# Patient Record
Sex: Female | Born: 1965 | Race: White | Hispanic: No | Marital: Married | Smoking: Never smoker
Health system: Southern US, Community
[De-identification: ages and names within clinical notes are randomized; demographics above are authoritative.]

## PROBLEM LIST (undated history)

## (undated) DIAGNOSIS — R072 Precordial pain: Secondary | ICD-10-CM

## (undated) DIAGNOSIS — E119 Type 2 diabetes mellitus without complications: Secondary | ICD-10-CM

## (undated) DIAGNOSIS — I514 Myocarditis, unspecified: Secondary | ICD-10-CM

## (undated) DIAGNOSIS — F329 Major depressive disorder, single episode, unspecified: Secondary | ICD-10-CM

## (undated) DIAGNOSIS — R0602 Shortness of breath: Secondary | ICD-10-CM

## (undated) DIAGNOSIS — F419 Anxiety disorder, unspecified: Secondary | ICD-10-CM

## (undated) DIAGNOSIS — I472 Ventricular tachycardia, unspecified: Secondary | ICD-10-CM

## (undated) DIAGNOSIS — E785 Hyperlipidemia, unspecified: Secondary | ICD-10-CM

## (undated) DIAGNOSIS — F32A Depression, unspecified: Secondary | ICD-10-CM

## (undated) HISTORY — DX: Ventricular tachycardia: I47.2

## (undated) HISTORY — DX: Shortness of breath: R06.02

## (undated) HISTORY — PX: TUBAL LIGATION: SHX77

## (undated) HISTORY — DX: Myocarditis, unspecified: I51.4

## (undated) HISTORY — PX: APPENDECTOMY: SHX54

## (undated) HISTORY — DX: Precordial pain: R07.2

## (undated) HISTORY — PX: BREAST SURGERY: SHX581

## (undated) HISTORY — DX: Ventricular tachycardia, unspecified: I47.20

---

## 2001-09-12 ENCOUNTER — Other Ambulatory Visit: Admission: RE | Admit: 2001-09-12 | Discharge: 2001-09-12 | Payer: Self-pay | Admitting: Family Medicine

## 2012-01-04 DIAGNOSIS — K449 Diaphragmatic hernia without obstruction or gangrene: Secondary | ICD-10-CM | POA: Insufficient documentation

## 2012-01-04 HISTORY — DX: Morbid (severe) obesity due to excess calories: E66.01

## 2012-01-04 HISTORY — DX: Diaphragmatic hernia without obstruction or gangrene: K44.9

## 2012-12-02 ENCOUNTER — Emergency Department (HOSPITAL_BASED_OUTPATIENT_CLINIC_OR_DEPARTMENT_OTHER)
Admission: EM | Admit: 2012-12-02 | Discharge: 2012-12-02 | Disposition: A | Discharge status: Home / Self Care | Payer: Managed Care, Other (non HMO) | Attending: Emergency Medicine | Admitting: Emergency Medicine

## 2012-12-02 ENCOUNTER — Emergency Department (HOSPITAL_BASED_OUTPATIENT_CLINIC_OR_DEPARTMENT_OTHER): Payer: Managed Care, Other (non HMO)

## 2012-12-02 ENCOUNTER — Encounter (HOSPITAL_BASED_OUTPATIENT_CLINIC_OR_DEPARTMENT_OTHER): Payer: Self-pay | Admitting: *Deleted

## 2012-12-02 DIAGNOSIS — F411 Generalized anxiety disorder: Secondary | ICD-10-CM | POA: Insufficient documentation

## 2012-12-02 DIAGNOSIS — L03039 Cellulitis of unspecified toe: Secondary | ICD-10-CM | POA: Insufficient documentation

## 2012-12-02 DIAGNOSIS — L03031 Cellulitis of right toe: Secondary | ICD-10-CM

## 2012-12-02 DIAGNOSIS — L03032 Cellulitis of left toe: Secondary | ICD-10-CM

## 2012-12-02 DIAGNOSIS — L02619 Cutaneous abscess of unspecified foot: Secondary | ICD-10-CM | POA: Insufficient documentation

## 2012-12-02 DIAGNOSIS — Z79899 Other long term (current) drug therapy: Secondary | ICD-10-CM | POA: Insufficient documentation

## 2012-12-02 DIAGNOSIS — F3289 Other specified depressive episodes: Secondary | ICD-10-CM | POA: Insufficient documentation

## 2012-12-02 DIAGNOSIS — E119 Type 2 diabetes mellitus without complications: Secondary | ICD-10-CM | POA: Insufficient documentation

## 2012-12-02 DIAGNOSIS — F329 Major depressive disorder, single episode, unspecified: Secondary | ICD-10-CM | POA: Insufficient documentation

## 2012-12-02 DIAGNOSIS — T8140XA Infection following a procedure, unspecified, initial encounter: Secondary | ICD-10-CM | POA: Insufficient documentation

## 2012-12-02 DIAGNOSIS — Y839 Surgical procedure, unspecified as the cause of abnormal reaction of the patient, or of later complication, without mention of misadventure at the time of the procedure: Secondary | ICD-10-CM | POA: Insufficient documentation

## 2012-12-02 HISTORY — DX: Anxiety disorder, unspecified: F41.9

## 2012-12-02 HISTORY — DX: Depression, unspecified: F32.A

## 2012-12-02 HISTORY — DX: Major depressive disorder, single episode, unspecified: F32.9

## 2012-12-02 HISTORY — DX: Type 2 diabetes mellitus without complications: E11.9

## 2012-12-02 LAB — CBC WITH DIFFERENTIAL/PLATELET
Basophils Relative: 1 % (ref 0–1)
HCT: 41.1 % (ref 36.0–46.0)
Hemoglobin: 14.1 g/dL (ref 12.0–15.0)
Lymphs Abs: 1.9 10*3/uL (ref 0.7–4.0)
MCHC: 34.3 g/dL (ref 30.0–36.0)
Monocytes Absolute: 0.6 10*3/uL (ref 0.1–1.0)
Monocytes Relative: 7 % (ref 3–12)
Neutro Abs: 6.1 10*3/uL (ref 1.7–7.7)
RBC: 4.6 MIL/uL (ref 3.87–5.11)

## 2012-12-02 LAB — BASIC METABOLIC PANEL
BUN: 14 mg/dL (ref 6–23)
CO2: 26 mEq/L (ref 19–32)
Chloride: 100 mEq/L (ref 96–112)
Creatinine, Ser: 0.9 mg/dL (ref 0.50–1.10)
GFR calc Af Amer: 87 mL/min — ABNORMAL LOW (ref >90)
Glucose, Bld: 337 mg/dL — ABNORMAL HIGH (ref 70–99)
Potassium: 4.1 mEq/L (ref 3.5–5.1)

## 2012-12-02 MED ORDER — AMOXICILLIN-POT CLAVULANATE 875-125 MG PO TABS: 1.0000 | ORAL_TABLET | Freq: Two times a day (BID) | ORAL | Status: DC

## 2012-12-02 MED ORDER — AMOXICILLIN-POT CLAVULANATE 875-125 MG PO TABS
1.0000 | ORAL_TABLET | Freq: Once | ORAL | Status: AC
  Administered 2012-12-02: 1 via ORAL
  Filled 2012-12-02: qty 1

## 2012-12-02 NOTE — ED Provider Notes (Signed)
History  This chart was scribed for Carleene Cooper III, MD by Ardeen Jourdain, ED Scribe. This patient was seen in room MH07/MH07 and the patient's care was started at 1526.  CSN: [id number]  Arrival date & time 12/02/12  1343   First MD Initiated Contact with Patient 12/02/12 1526      Chief Complaint  Patient presents with  . Wound Infection     The history is provided by the patient. No language interpreter was used.    Alexandra Carr is a 47 y.o. female who presents to the Emergency Department complaining of gradual onset, gradually worsening, constant bilateral large toe infection with associated pain, redness, and drainage. Pt states she had "ingrown toenail surgery" March 28th. She states she woke up a few days later with a "throbbing" pain in the toes. She states she was evaluated and prescribed antibiotics 4/2. She describes the pain as a throbbing and aching pain that feels like "rubber bands are on her toes." She states she noticed some red streaking up the left toe a few days ago. She states the streaking went away on its own. Pt denies any fever, ear ache, sore throat, cough, CP, difficulty urination, rash, dizziness, syncope, seizures and SOB as associated symptoms. Pt states she has been soaking her feet per her PCP's advice.    Past Medical History  Diagnosis Date  . Diabetes mellitus without complication   . Depressed   . Anxiety     Past Surgical History  Procedure Laterality Date  . Appendectomy    . Breast surgery    . Tubal ligation      History reviewed. No pertinent family history.  History  Substance Use Topics  . Smoking status: Never Smoker   . Smokeless tobacco: Not on file  . Alcohol Use: No   No OB history available.   Review of Systems  Constitutional: Negative for fever and chills.  Respiratory: Negative for shortness of breath.   Gastrointestinal: Negative for nausea and vomiting.  Skin: Positive for wound.       Bilateral large toe  infection   Neurological: Negative for weakness.  All other systems reviewed and are negative.    Allergies  Review of patient's allergies indicates no known allergies.  Home Medications   Current Outpatient Rx  Name  Route  Sig  Dispense  Refill  . buPROPion (WELLBUTRIN XL) 150 MG 24 hr tablet   Oral   Take 150 mg by mouth daily.         Marland Kitchen escitalopram (LEXAPRO) 20 MG tablet   Oral   Take 20 mg by mouth daily.         Marland Kitchen HYDROcodone-acetaminophen (LORTAB) 7.5-500 MG per tablet   Oral   Take 1 tablet by mouth every 6 (six) hours as needed for pain.         . metFORMIN (GLUMETZA) 1000 MG (MOD) 24 hr tablet   Oral   Take 1,000 mg by mouth 2 (two) times daily with a meal.         . minocycline (DYNACIN) 100 MG tablet   Oral   Take 100 mg by mouth 2 (two) times daily.           Triage Vitals: BP 165/100  Pulse 120  Temp(Src) 99 F (37.2 C) (Oral)  Resp 16  Ht 5\' 5"  (1.651 m)  Wt 264 lb (119.75 kg)  BMI 43.93 kg/m2  SpO2 100%  Physical Exam  Nursing note and vitals  reviewed. Constitutional: She is oriented to person, place, and time. She appears well-developed and well-nourished. No distress.  HENT:  Head: Normocephalic and atraumatic.  Right Ear: External ear normal.  Left Ear: External ear normal.  Mouth/Throat: Oropharynx is clear and moist. No oropharyngeal exudate.  TMs normal bilaterally   Eyes: Conjunctivae and EOM are normal. Pupils are equal, round, and reactive to light. Right eye exhibits no discharge. Left eye exhibits no discharge. No scleral icterus.  Neck: Normal range of motion. Neck supple. No tracheal deviation present.  Cardiovascular: Normal rate, regular rhythm and normal heart sounds.  Exam reveals no gallop and no friction rub.   No murmur heard. Pulmonary/Chest: Effort normal and breath sounds normal. No respiratory distress. She has no wheezes. She has no rales. She exhibits no tenderness.  Abdominal: Soft. Bowel sounds are  normal. She exhibits no distension and no mass. There is no tenderness. There is no rebound and no guarding.  Musculoskeletal: Normal range of motion. She exhibits no edema.  Neurological: She is alert and oriented to person, place, and time.  Skin: Skin is warm. She is not diaphoretic. There is erythema.  Redness of the skin of distal phalanx , exudate surrounding the edges of the nails with crusting and weeping   Psychiatric: She has a normal mood and affect. Her behavior is normal.    ED Course  Procedures (including critical care time)  DIAGNOSTIC STUDIES: Oxygen Saturation is 100% on room air, normal by my interpretation.    COORDINATION OF CARE:  3:44 PM-Discussed treatment plan which includes culture of the exudate and blood work with pt at bedside and pt agreed to plan.    Results for orders placed during the hospital encounter of 12/02/12  CBC WITH DIFFERENTIAL      Result Value Range   WBC 8.8  4.0 - 10.5 K/uL   RBC 4.60  3.87 - 5.11 MIL/uL   Hemoglobin 14.1  12.0 - 15.0 g/dL   HCT 45.4  09.8 - 11.9 %   MCV 89.3  78.0 - 100.0 fL   MCH 30.7  26.0 - 34.0 pg   MCHC 34.3  30.0 - 36.0 g/dL   RDW 14.7  82.9 - 56.2 %   Platelets 203  150 - 400 K/uL   Neutrophils Relative 70  43 - 77 %   Neutro Abs 6.1  1.7 - 7.7 K/uL   Lymphocytes Relative 22  12 - 46 %   Lymphs Abs 1.9  0.7 - 4.0 K/uL   Monocytes Relative 7  3 - 12 %   Monocytes Absolute 0.6  0.1 - 1.0 K/uL   Eosinophils Relative 1  0 - 5 %   Eosinophils Absolute 0.1  0.0 - 0.7 K/uL   Basophils Relative 1  0 - 1 %   Basophils Absolute 0.1  0.0 - 0.1 K/uL  BASIC METABOLIC PANEL      Result Value Range   Sodium 137  135 - 145 mEq/L   Potassium 4.1  3.5 - 5.1 mEq/L   Chloride 100  96 - 112 mEq/L   CO2 26  19 - 32 mEq/L   Glucose, Bld 337 (*) 70 - 99 mg/dL   BUN 14  6 - 23 mg/dL   Creatinine, Ser 1.30  0.50 - 1.10 mg/dL   Calcium 9.3  8.4 - 86.5 mg/dL   GFR calc non Af Amer 75 (*) >90 mL/min   GFR calc Af Amer 87  (*) >90 mL/min  Dg Toe Great Left  12/02/2012  *RADIOLOGY REPORT*  Clinical Data: Wound infection.  Excision of ingrown toenail.  Now infected.  LEFT GREAT TOE  Comparison: None.  Findings: There is no evidence for acute fracture.  No definite bony erosion, soft tissue gas, or radiopaque foreign body.  IMPRESSION: Negative exam.   Original Report Authenticated By: Norva Pavlov, M.D.    Dg Toe Great Right  12/02/2012  *RADIOLOGY REPORT*  Clinical Data: Wound infection.  47 year old diabetic.  Excision of ingrown toenail.  Infected.  RIGHT GREAT TOE  Comparison: None.  Findings: There is no evidence for acute fracture.  The lateral cortex of the distal aspect of the distal phalanx of the great toe is somewhat ill-defined, making it difficult to exclude osteomyelitis in this region.  No radiopaque foreign body or soft tissue gas identified.  IMPRESSION: Somewhat ill-defined cortex of the distal aspect of the distal phalanx of the great toe as described.  Osteomyelitis cannot be entirely excluded.   Original Report Authenticated By: Norva Pavlov, M.D.     Pt. Had physical examination and lab tests and x-rays of both great toes.  Malon Kindle, M.D., orthopedic surgeon, was contacte.  He will see the patient in the office tomorrow. He advised the patient to take Augmentin 875 mg twice a day.  1. Cellulitis of great toe of left foot   2. Cellulitis of great toe of right foot       Carleene Cooper III, MD 12/02/12 1739

## 2012-12-02 NOTE — ED Notes (Signed)
Pt had bil toe surgery on march 28, placed on abx April 2 , bil toes red with drainage

## 2012-12-02 NOTE — ED Notes (Signed)
Pt. Had bilateral great toe surgery for ingrown toenails 11/22/12. Currently taking minocycline.  Bilateral great toes painful and red since 11/26/12.  No fever.

## 2012-12-04 LAB — WOUND CULTURE: Gram Stain: NONE SEEN

## 2012-12-05 ENCOUNTER — Telehealth (HOSPITAL_COMMUNITY): Payer: Self-pay | Admitting: Emergency Medicine

## 2012-12-08 ENCOUNTER — Telehealth (HOSPITAL_COMMUNITY): Payer: Self-pay | Admitting: Emergency Medicine

## 2012-12-08 NOTE — ED Notes (Signed)
Chart returned from EDP office. Per Roxy Horseman PA-C, return for wound check if still having symptoms.

## 2012-12-09 ENCOUNTER — Telehealth (HOSPITAL_COMMUNITY): Payer: Self-pay | Admitting: Emergency Medicine

## 2012-12-10 ENCOUNTER — Telehealth (HOSPITAL_COMMUNITY): Payer: Self-pay | Admitting: Emergency Medicine

## 2013-02-20 ENCOUNTER — Encounter (HOSPITAL_BASED_OUTPATIENT_CLINIC_OR_DEPARTMENT_OTHER): Payer: Self-pay | Admitting: *Deleted

## 2013-02-20 ENCOUNTER — Emergency Department (HOSPITAL_BASED_OUTPATIENT_CLINIC_OR_DEPARTMENT_OTHER)
Admission: EM | Admit: 2013-02-20 | Discharge: 2013-02-20 | Disposition: A | Discharge status: Home / Self Care | Payer: Managed Care, Other (non HMO) | Attending: Emergency Medicine | Admitting: Emergency Medicine

## 2013-02-20 DIAGNOSIS — E785 Hyperlipidemia, unspecified: Secondary | ICD-10-CM | POA: Insufficient documentation

## 2013-02-20 DIAGNOSIS — R05 Cough: Secondary | ICD-10-CM | POA: Insufficient documentation

## 2013-02-20 DIAGNOSIS — L299 Pruritus, unspecified: Secondary | ICD-10-CM | POA: Insufficient documentation

## 2013-02-20 DIAGNOSIS — F329 Major depressive disorder, single episode, unspecified: Secondary | ICD-10-CM | POA: Insufficient documentation

## 2013-02-20 DIAGNOSIS — R059 Cough, unspecified: Secondary | ICD-10-CM | POA: Insufficient documentation

## 2013-02-20 DIAGNOSIS — F411 Generalized anxiety disorder: Secondary | ICD-10-CM | POA: Insufficient documentation

## 2013-02-20 DIAGNOSIS — F3289 Other specified depressive episodes: Secondary | ICD-10-CM | POA: Insufficient documentation

## 2013-02-20 DIAGNOSIS — Z79899 Other long term (current) drug therapy: Secondary | ICD-10-CM | POA: Insufficient documentation

## 2013-02-20 DIAGNOSIS — T7840XA Allergy, unspecified, initial encounter: Secondary | ICD-10-CM

## 2013-02-20 DIAGNOSIS — R21 Rash and other nonspecific skin eruption: Secondary | ICD-10-CM | POA: Insufficient documentation

## 2013-02-20 DIAGNOSIS — E119 Type 2 diabetes mellitus without complications: Secondary | ICD-10-CM | POA: Insufficient documentation

## 2013-02-20 DIAGNOSIS — Z792 Long term (current) use of antibiotics: Secondary | ICD-10-CM | POA: Insufficient documentation

## 2013-02-20 DIAGNOSIS — R209 Unspecified disturbances of skin sensation: Secondary | ICD-10-CM | POA: Insufficient documentation

## 2013-02-20 HISTORY — DX: Hyperlipidemia, unspecified: E78.5

## 2013-02-20 MED ORDER — EPINEPHRINE 0.3 MG/0.3ML IJ SOAJ: 0.3000 mg | INTRAMUSCULAR | Status: DC | PRN

## 2013-02-20 MED ORDER — METHYLPREDNISOLONE SODIUM SUCC 125 MG IJ SOLR
125.0000 mg | Freq: Once | INTRAMUSCULAR | Status: AC
  Administered 2013-02-20: 125 mg via INTRAVENOUS
  Filled 2013-02-20: qty 2

## 2013-02-20 NOTE — ED Provider Notes (Signed)
History    CSN: [id number] Arrival date & time 02/20/13  1547  First MD Initiated Contact with Patient 02/20/13 1613     Chief Complaint  Patient presents with  . Allergic Reaction   (Consider location/radiation/quality/duration/timing/severity/associated sxs/prior Treatment) HPI Comments: Pt states that she started with having hives and itching on the hand and then she had a really bad cough and tingling of her mouth and tongue:pt is feeling better overall at this time  Patient is a 47 y.o. female presenting with allergic reaction. The history is provided by the patient and the EMS personnel. No language interpreter was used.  Allergic Reaction Presenting symptoms: itching and rash   Severity:  Moderate Prior allergic episodes:  No prior episodes Relieved by:  Nothing Worsened by:  Nothing tried  Past Medical History  Diagnosis Date  . Diabetes mellitus without complication   . Depressed   . Anxiety   . Hyperlipemia    Past Surgical History  Procedure Laterality Date  . Appendectomy    . Breast surgery    . Tubal ligation     History reviewed. No pertinent family history. History  Substance Use Topics  . Smoking status: Never Smoker   . Smokeless tobacco: Not on file  . Alcohol Use: No   OB History   Grav Para Term Preterm Abortions TAB SAB Ect Mult Living                 Review of Systems  Constitutional: Negative.   Respiratory: Negative.   Cardiovascular: Negative.   Skin: Positive for itching and rash.    Allergies  Review of patient's allergies indicates no known allergies.  Home Medications   Current Outpatient Rx  Name  Route  Sig  Dispense  Refill  . atorvastatin (LIPITOR) 10 MG tablet   Oral   Take 10 mg by mouth daily.         . cholecalciferol (VITAMIN D) 1000 UNITS tablet   Oral   Take 5,000 Units by mouth once a week.         Marland Kitchen glipiZIDE (GLUCOTROL) 5 MG tablet   Oral   Take 5 mg by mouth 2 (two) times daily before a meal.          . amoxicillin-clavulanate (AUGMENTIN) 875-125 MG per tablet   Oral   Take 1 tablet by mouth every 12 (twelve) hours.   14 tablet   0   . buPROPion (WELLBUTRIN XL) 150 MG 24 hr tablet   Oral   Take 300 mg by mouth daily.          Marland Kitchen escitalopram (LEXAPRO) 20 MG tablet   Oral   Take 20 mg by mouth daily.         Marland Kitchen HYDROcodone-acetaminophen (LORTAB) 7.5-500 MG per tablet   Oral   Take 1 tablet by mouth every 6 (six) hours as needed for pain.         . metFORMIN (GLUMETZA) 1000 MG (MOD) 24 hr tablet   Oral   Take 1,000 mg by mouth 2 (two) times daily with a meal.         . minocycline (DYNACIN) 100 MG tablet   Oral   Take 100 mg by mouth 2 (two) times daily.          BP 138/97  Pulse 122  Temp(Src) 97.4 F (36.3 C) (Oral)  Resp 24  Ht 5\' 5"  (1.651 m)  Wt 260 lb (117.935 kg)  BMI  43.27 kg/m2  SpO2 98% Physical Exam  Nursing note and vitals reviewed. Constitutional: She is oriented to person, place, and time. She appears well-developed and well-nourished.  HENT:  No oral swelling noted  Eyes: Conjunctivae and EOM are normal.  Neck: Neck supple.  Cardiovascular: Normal rate and regular rhythm.   Pulmonary/Chest: Effort normal and breath sounds normal.  Abdominal: Soft. Bowel sounds are normal.  Musculoskeletal: Normal range of motion.  Neurological: She is alert and oriented to person, place, and time.  Skin:  Mild redness noted to palms bilaterally    ED Course  Procedures (including critical care time) Labs Reviewed - No data to display No results found. 1. Allergic reaction, initial encounter     MDM  Pt is doing better at this time:no oral swelling or respiratory problems noted:pt has no rash:pt instructed on epi pen use and allergist follow up  Teressa Lower, NP 02/20/13 1942

## 2013-02-20 NOTE — ED Notes (Signed)
Patient states she developed itching of her bilateral palms at 1430 today.  States the itching rapidly increased so she took two benadryl 25 mg po.  States shortly after taking the benadryl she developed generalized hives and coughing.  States her coughing was so intense she had a bluish color to her lips.  EMS called, IV zantac given with epi sq.  Pt palms are red, itching is improved, bbs clear through out,

## 2013-02-20 NOTE — ED Notes (Signed)
Pt brought in by EMS form work with allergic reaction from unknown. Hives to bil upp extr and neck, benadryl , zantac and epi given PTA by EMS

## 2013-02-20 NOTE — ED Provider Notes (Signed)
Medical screening examination/treatment/procedure(s) were performed by non-physician practitioner and as supervising physician I was immediately available for consultation/collaboration.   Aristeo Hankerson, MD 02/20/13 2228 

## 2013-03-24 ENCOUNTER — Telehealth (HOSPITAL_COMMUNITY): Payer: Self-pay | Admitting: *Deleted

## 2013-05-13 DIAGNOSIS — G4700 Insomnia, unspecified: Secondary | ICD-10-CM

## 2013-05-13 HISTORY — DX: Insomnia, unspecified: G47.00

## 2013-08-08 DIAGNOSIS — E785 Hyperlipidemia, unspecified: Secondary | ICD-10-CM

## 2013-08-08 HISTORY — DX: Hyperlipidemia, unspecified: E78.5

## 2013-09-16 DIAGNOSIS — Z9884 Bariatric surgery status: Secondary | ICD-10-CM

## 2013-09-16 DIAGNOSIS — E46 Unspecified protein-calorie malnutrition: Secondary | ICD-10-CM | POA: Insufficient documentation

## 2013-09-16 HISTORY — DX: Unspecified protein-calorie malnutrition: E46

## 2013-09-16 HISTORY — DX: Bariatric surgery status: Z98.84

## 2013-10-30 DIAGNOSIS — F329 Major depressive disorder, single episode, unspecified: Secondary | ICD-10-CM | POA: Insufficient documentation

## 2013-10-30 DIAGNOSIS — F32A Depression, unspecified: Secondary | ICD-10-CM | POA: Insufficient documentation

## 2013-10-30 HISTORY — DX: Major depressive disorder, single episode, unspecified: F32.9

## 2013-10-30 HISTORY — DX: Depression, unspecified: F32.A

## 2014-04-27 DIAGNOSIS — Z9884 Bariatric surgery status: Secondary | ICD-10-CM

## 2014-04-27 HISTORY — DX: Bariatric surgery status: Z98.84

## 2015-03-18 IMAGING — CR DG TOE GREAT 2+V*R*
3 series · 3 of 3 positions shown · non-contrast
Comparison: None.

CLINICAL DATA: Wound infection.  46-year-old diabetic.  Excision of
ingrown toenail.  Infected.

RIGHT GREAT TOE

[t toes ap right]
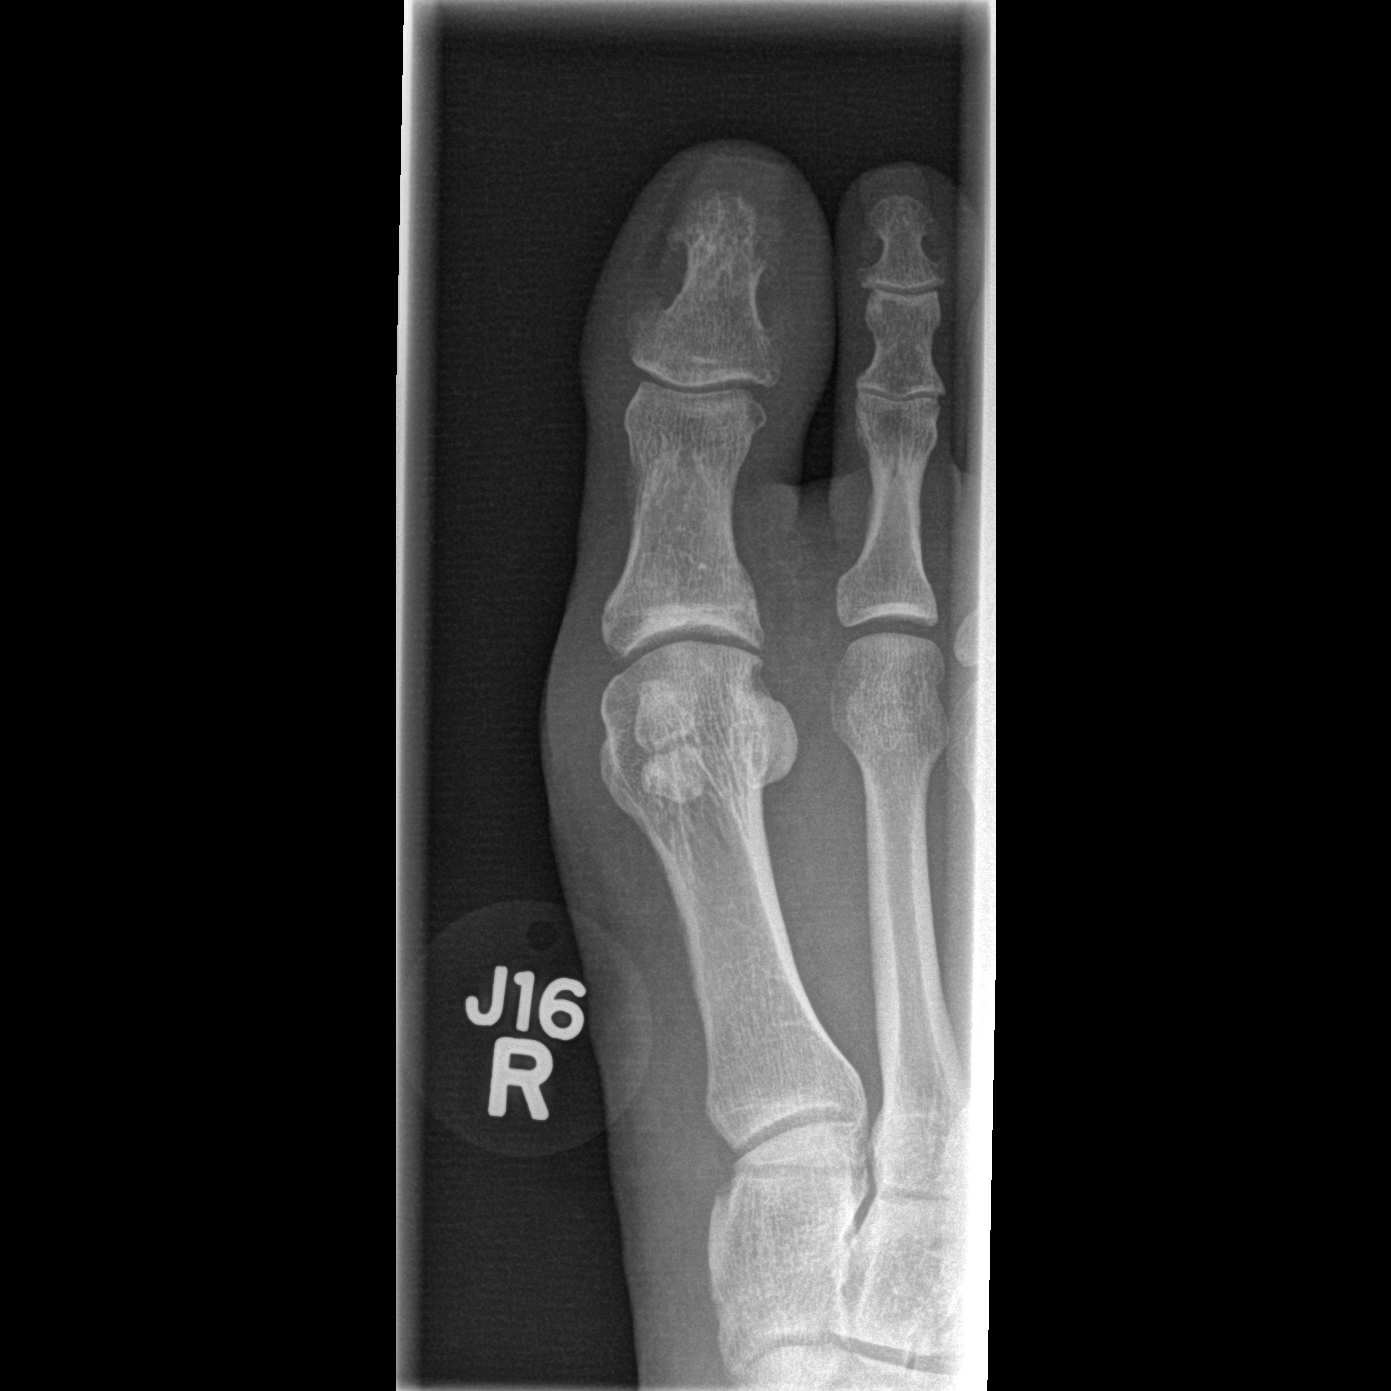

[t toes oblique right]
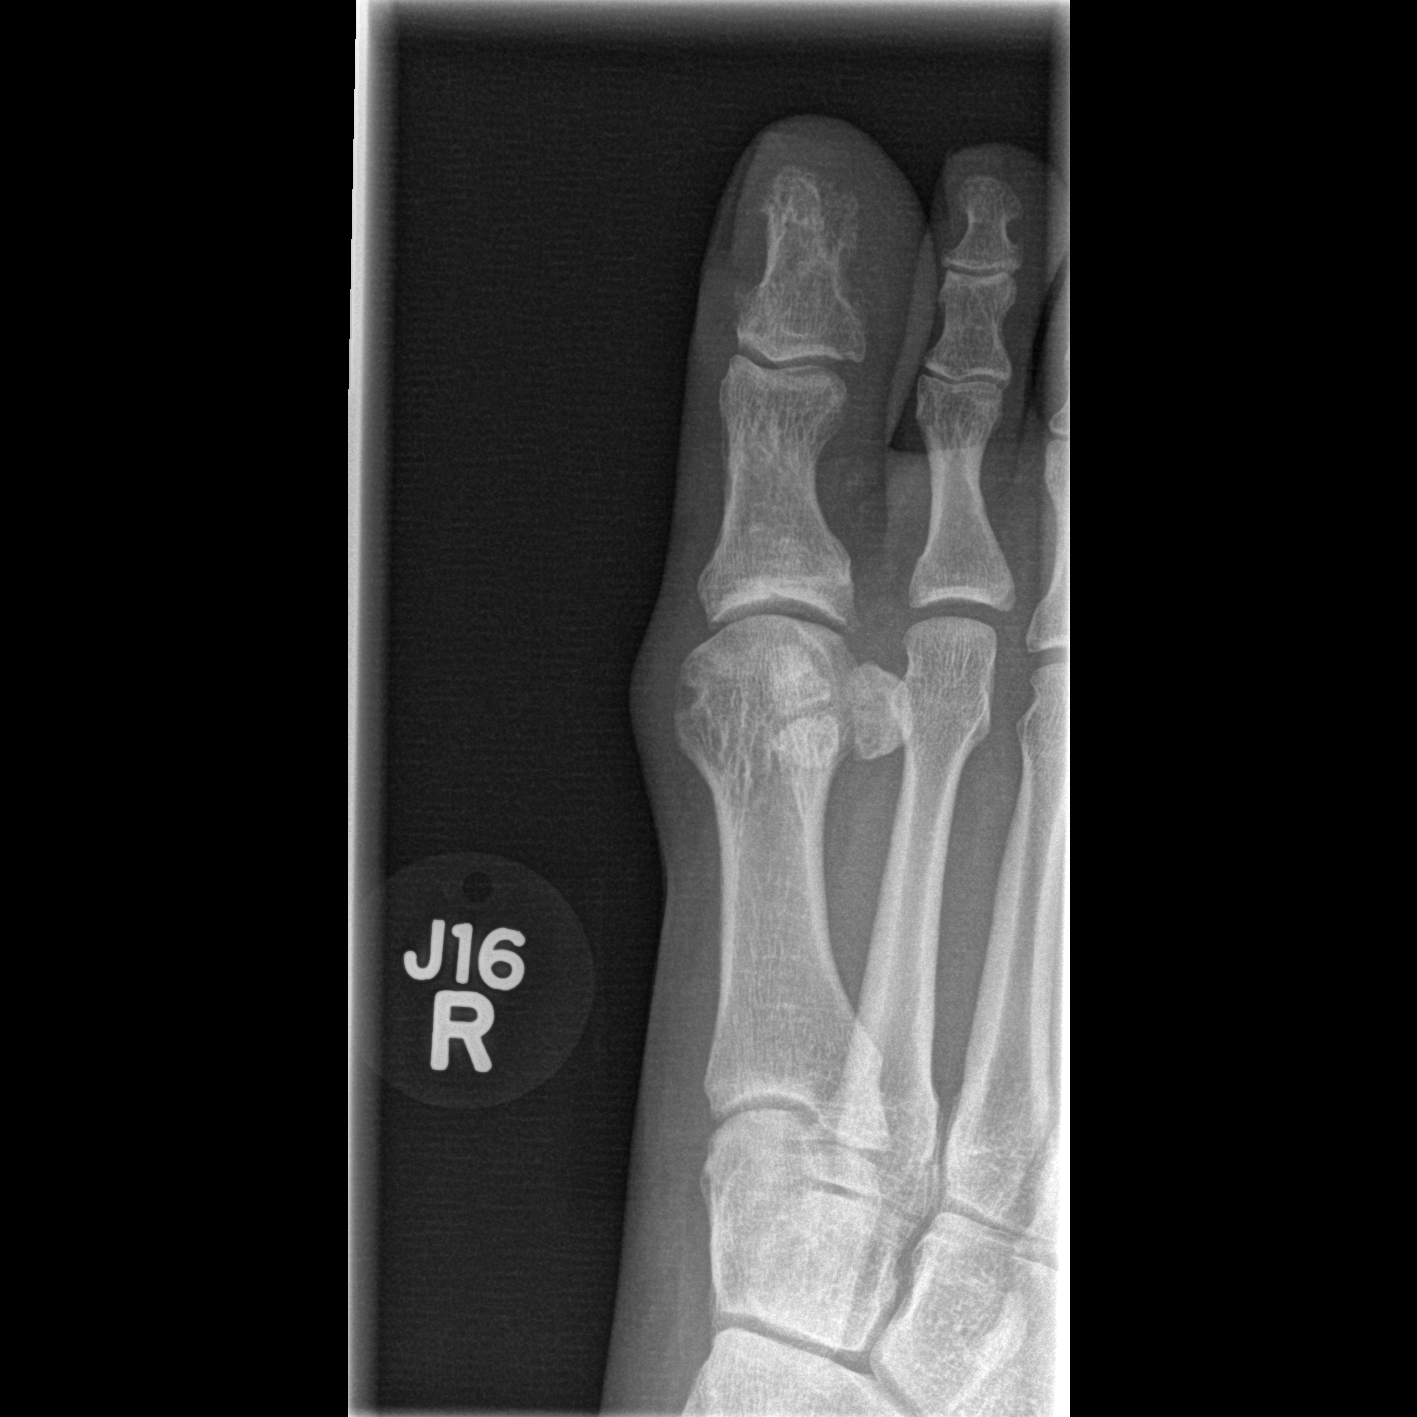

[t toes lateral right]
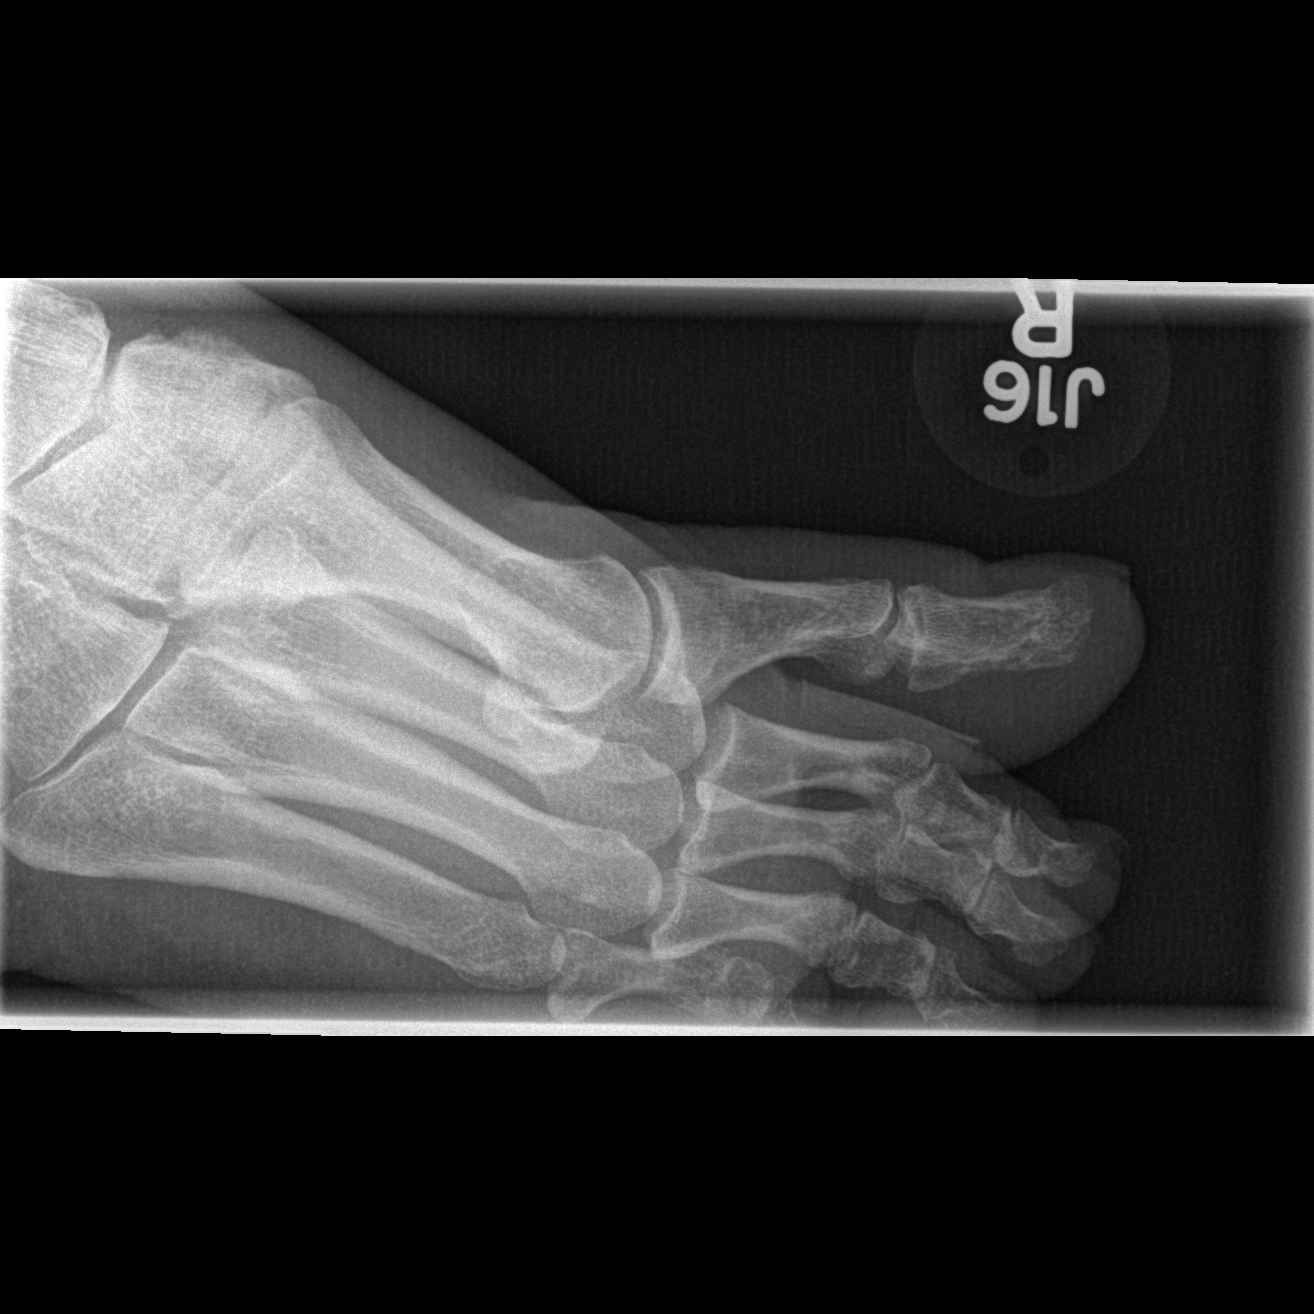

[3 of 3 positions shown; findings below may reference images not displayed]

FINDINGS: There is no evidence for acute fracture.  The lateral
cortex of the distal aspect of the distal phalanx of the great toe
is somewhat ill-defined, making it difficult to exclude
osteomyelitis in this region.  No radiopaque foreign body or soft
tissue gas identified.
IMPRESSION: Somewhat ill-defined cortex of the distal aspect of the distal
phalanx of the great toe as described.  Osteomyelitis cannot be
entirely excluded.

## 2018-12-18 ENCOUNTER — Telehealth: Payer: Managed Care, Other (non HMO) | Admitting: Family

## 2018-12-18 DIAGNOSIS — R52 Pain, unspecified: Secondary | ICD-10-CM

## 2018-12-18 NOTE — Progress Notes (Signed)
E-Visit for Corona Virus Screening  Based on your current symptoms, it seems unlikely that your symptoms are related to the Coronavirus.   Coronavirus disease 2019 (COVID-19) is a respiratory illness that can spread from person to person. The virus that causes COVID-19 is a new virus that was first identified in the country of [place] but is now found in multiple other countries and has spread to the [place].  Symptoms associated with the virus are mild to severe fever, cough, and shortness of breath. There is currently no vaccine to protect against COVID-19, and there is no specific antiviral treatment for the virus.   To be considered HIGH RISK for Coronavirus (COVID-19), you have to meet the following criteria:  . Traveled to [place], [place], [place], [place] or [place]; or in the [place] to [place], [place], [place], or [place]; and have fever, cough, and shortness of breath within the last 2 weeks of travel OR  . Been in close contact with a person diagnosed with COVID-19 within the last 2 weeks and have fever, cough, and shortness of breath  . IF YOU DO NOT MEET THESE CRITERIA, YOU ARE CONSIDERED LOW RISK FOR COVID-19.   It is vitally important that if you feel that you have an infection such as this virus or any other virus that you stay home and away from places where you may spread it to others.  You should self-quarantine for 14 days if you have symptoms that could potentially be coronavirus and avoid contact with people age 65 and older.    You may also take acetaminophen (Tylenol) as needed for fever.   Reduce your risk of any infection by using the same precautions used for avoiding the common cold or flu:  . Wash your hands often with soap and warm water for at least 20 seconds.  If soap and water are not readily available, use an alcohol-based hand sanitizer with at least 60% alcohol.  . If coughing or sneezing, cover your mouth and nose by coughing or sneezing into  the elbow areas of your shirt or coat, into a tissue or into your sleeve (not your hands). . Avoid shaking hands with others and consider head nods or verbal greetings only. . Avoid touching your eyes, nose, or mouth with unwashed hands.  . Avoid close contact with people who are sick. . Avoid places or events with large numbers of people in one location, like concerts or sporting events. . Carefully consider travel plans you have or are making. . If you are planning any travel outside or inside the [place], visit the CDC's Travelers' Health webpage for the latest health notices. . If you have some symptoms but not all symptoms, continue to monitor at home and seek medical attention if your symptoms worsen. . If you are having a medical emergency, call 911.  HOME CARE . Only take medications as instructed by your medical team. . Drink plenty of fluids and get plenty of rest. . A steam or ultrasonic humidifier can help if you have congestion.   GET HELP RIGHT AWAY IF: . You develop worsening fever. . You become short of breath . You cough up blood. . Your symptoms become more severe MAKE SURE YOU   Understand these instructions.  Will watch your condition.  Will get help right away if you are not doing well or get worse.  Your e-visit answers were reviewed by a board certified advanced clinical practitioner to complete your   personal care plan.  Depending on the condition, your plan could have included both over the counter or prescription medications.  If there is a problem please reply once you have received a response from your provider. Your safety is important to [place].  If you have drug allergies check your prescription carefully.    You can use MyChart to ask questions about today's visit, request a non-urgent call back, or ask for a work or school excuse for 24 hours related to this e-Visit. If it has been greater than 24 hours you will need to follow up with your provider, or enter a  new e-Visit to address those concerns. You will get an e-mail in the next two days asking about your experience.  I hope that your e-visit has been valuable and will speed your recovery. Thank you for using e-visits.  Greater than 5 minutes, yet less than 10 minutes of time have been spent researching, coordinating, and implementing care for this patient today.  Thank you for the details you included in the comment boxes. Those details are very helpful in determining the best course of treatment for you and help [place] to provide the best care.

## 2019-01-22 DIAGNOSIS — U071 COVID-19: Secondary | ICD-10-CM

## 2019-01-22 HISTORY — DX: COVID-19: U07.1

## 2019-01-23 DIAGNOSIS — D696 Thrombocytopenia, unspecified: Secondary | ICD-10-CM

## 2019-01-23 HISTORY — DX: Thrombocytopenia, unspecified: D69.6

## 2019-02-14 DIAGNOSIS — L02214 Cutaneous abscess of groin: Secondary | ICD-10-CM | POA: Insufficient documentation

## 2019-02-14 HISTORY — DX: Cutaneous abscess of groin: L02.214

## 2019-02-18 DIAGNOSIS — J9601 Acute respiratory failure with hypoxia: Secondary | ICD-10-CM | POA: Insufficient documentation

## 2019-02-18 HISTORY — DX: Acute respiratory failure with hypoxia: J96.01

## 2019-03-07 DIAGNOSIS — H9319 Tinnitus, unspecified ear: Secondary | ICD-10-CM | POA: Insufficient documentation

## 2019-03-07 HISTORY — DX: Tinnitus, unspecified ear: H93.19

## 2019-06-07 DIAGNOSIS — G930 Cerebral cysts: Secondary | ICD-10-CM | POA: Insufficient documentation

## 2019-06-07 HISTORY — DX: Cerebral cysts: G93.0

## 2020-01-22 DIAGNOSIS — J984 Other disorders of lung: Secondary | ICD-10-CM | POA: Insufficient documentation

## 2020-01-22 HISTORY — DX: Other disorders of lung: J98.4

## 2020-09-17 NOTE — Progress Notes (Signed)
Office Visit Note  Patient: Alexandra Carr             Date of Birth: Aug 30, 1965           MRN: [id number]             PCP: Nicola Girt, DO Referring: Ronita Hipps, MD Visit Date: 10/01/2020 Occupation: @GUAROCC @  Subjective:  Pain in both hands.   History of Present Illness: Alexandra Carr is a 55 y.o. female in consultation per request of Dr. Helene Kelp.  According the patient in May 2020 she acquired COVID-19 infection.  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  She states the myocarditis has become stable.  Soon after the infection with COVID-19 she started having burning sensation in her ankles and her feet.  She was referred to [facility] neurology department.  She states she had nerve conduction velocities and EMGs which were normal.  She was also advised muscle biopsy but she could not get it due to insurance issues.  She was placed on gabapentin which has been helpful to some extent.  She has been also experiencing memory loss and MRI of the brain showed a small cyst.  She was also referred to neuropsych but she could not go due to insurance coverage.  She has been having chronic diarrhea and had a colonoscopy which was inconclusive.  According the patient it showed some inflammation but no treatment was advised.  She continues to have diarrhea.  She states in July 2021 she started noticing prominence of her knuckles and also some wrist joint pain.  She had some lab work and her rheumatoid factor was low positive she was referred to me for further evaluation.  She denies any history of joint swelling none of the other joints are involved.  She has had some lower back pain for many years.  She also gives history of chronic insomnia.  She has been experiencing more fatigue since the COVID-19 infection.  She also had pulmonary work-up and was told that she had mild scarring.  She was given inhaler to be used as needed.  There is  no history of oral ulcers nasal ulcers malar rash photosensitivity sicca symptoms Raynaud's phenomenon or lymphadenopathy.  There is no family history of autoimmune disease.  She is gravida 4, para 2, medical termination of pregnancy 2.  Activities of Daily Living:  Patient reports morning stiffness for 0 minutes.   Patient Denies nocturnal pain.  Difficulty dressing/grooming: Denies Difficulty climbing stairs: Reports Difficulty getting out of chair: Reports Difficulty using hands for taps, buttons, cutlery, and/or writing: Reports  Review of Systems  Constitutional: Positive for fatigue.  HENT: Positive for nose dryness. Negative for mouth sores and mouth dryness.   Eyes: Negative for pain, itching and dryness.  Respiratory: Positive for shortness of breath. Negative for difficulty breathing.   Cardiovascular: Negative for chest pain and palpitations.  Gastrointestinal: Positive for diarrhea. Negative for blood in stool and constipation.  Endocrine: Negative for increased urination.  Genitourinary: Negative for difficulty urinating.  Musculoskeletal: Positive for arthralgias, joint pain and joint swelling. Negative for myalgias, morning stiffness, muscle tenderness and myalgias.  Skin: Positive for color change. Negative for rash and redness.  Allergic/Immunologic: Negative for susceptible to infections.  Neurological: Positive for numbness, headaches, memory loss and weakness. Negative for dizziness.  Hematological: Negative for bruising/bleeding tendency.  Psychiatric/Behavioral: Negative for confusion and sleep disturbance.    PMFS History:  Patient Active Problem List   Diagnosis Date Noted  . History of COVID-19 10/01/2020  . Vitamin D deficiency 10/01/2020  . History of depression 10/01/2020  . GAD (generalized anxiety disorder) 10/01/2020  . Other insomnia 10/01/2020  . History of hyperlipidemia 10/01/2020  . History of type 2 diabetes mellitus 10/01/2020    Past  Medical History:  Diagnosis Date  . Anxiety   . Depressed   . Diabetes mellitus without complication ([place])   . Hyperlipemia     Family History  Problem Relation Age of Onset  . Dementia Mother   . Diabetes Mother   . Thyroid cancer Mother   . High Cholesterol Mother   . Heart failure Father   . Skin cancer Father   . Healthy Son   . Healthy Daughter    Past Surgical History:  Procedure Laterality Date  . APPENDECTOMY    . BREAST SURGERY    . TUBAL LIGATION     Social History   Social History Narrative  . Not on file    There is no immunization history on file for this patient.   Objective: Vital Signs: BP 125/84 (BP Location: Right Wrist, Patient Position: Sitting, Cuff Size: Normal)   Pulse 83   Resp 17   Ht 5' 5"  (1.651 m)   Wt 231 lb (104.8 kg)   BMI 38.44 kg/m    Physical Exam Vitals and nursing note reviewed.  Constitutional:      Appearance: She is well-developed and well-nourished.  HENT:     Head: Normocephalic and atraumatic.  Eyes:     Extraocular Movements: EOM normal.     Conjunctiva/sclera: Conjunctivae normal.  Cardiovascular:     Rate and Rhythm: Normal rate and regular rhythm.     Pulses: Intact distal pulses.     Heart sounds: Normal heart sounds.  Pulmonary:     Effort: Pulmonary effort is normal.     Breath sounds: Normal breath sounds.  Abdominal:     General: Bowel sounds are normal.     Palpations: Abdomen is soft.  Musculoskeletal:     Cervical back: Normal range of motion.  Lymphadenopathy:     Cervical: No cervical adenopathy.  Skin:    General: Skin is warm and dry.     Capillary Refill: Capillary refill takes less than 2 seconds.  Neurological:     Mental Status: She is alert and oriented to person, place, and time.  Psychiatric:        Mood and Affect: Mood and affect normal.        Behavior: Behavior normal.      Musculoskeletal Exam: C-spine was in good range of motion.  Shoulder joints and elbow joints with good  range of motion.  She had good range of motion of bilateral wrist joints with no synovitis.  She describes some tenderness over the flexor aspect of her wrists.  No MCP swelling or tenderness was noted.  She has bilateral PIP and DIP prominence.  Hip joints and knee joints in good range of motion.  There was no tenderness over MTPs or PIPs some prominence of PIP and DIP joints was noted.  CDAI Exam: CDAI Score: -- Patient Global: --; Provider Global: -- Swollen: --; Tender: -- Joint Exam 10/01/2020   No joint exam has been documented for this visit   There is currently no information documented on the homunculus. Go to the Rheumatology activity and complete the homunculus joint exam.  Investigation: No additional findings.  Imaging: No results found.  Recent Labs: Lab Results  Component Value Date   WBC 8.8 12/02/2012   HGB 14.1 12/02/2012   PLT 203 12/02/2012   NA 137 12/02/2012   K 4.1 12/02/2012   CL 100 12/02/2012   CO2 26 12/02/2012   GLUCOSE 337 (H) 12/02/2012   BUN 14 12/02/2012   CREATININE 0.90 12/02/2012   CALCIUM 9.3 12/02/2012   GFRAA 87 (L) 12/02/2012    Speciality Comments: No specialty comments available.  Procedures:  No procedures performed Allergies: Gadobutrol   Assessment / Plan:     Visit Diagnoses: Pain in both hands -she complains of pain and discomfort in her bilateral hands and wrist joints.  No synovitis was noted.  She has bilateral PIP and DIP thickening consistent with osteoarthritis.  I will obtain additional labs and x-rays today.  04/08/20: ANA negative, RF 22.4, ESR 20 - Plan: XR Hand 2 View Right, XR Hand 2 View Left.  X-rays were consistent with osteoarthritis of bilateral hands.  Pain in both feet -she complains of neuropathy in her feet and discomfort in her feet.  She had extensive neurological work-up in the past including EMG nerve conduction velocities per patient.  She was placed on gabapentin which has been helpful.  Plan: XR Foot  2 Views Right, XR Foot 2 Views Left.  X-rays were consistent with osteoarthritis.  Neuropathy-involving her feet.  She is on gabapentin which has been helpful.  Thrombocytopenia (HCC)-patient gives history of thrombocytopenia since COVID-19.  I could not find any lab values indicating thrombocytopenia.  We will check CBC with differential today.  Memory loss-she gives history of memory loss since the COVID-19 infection.  She states she could not go for neuropsych testing due to the insurance coverage.  History of type 2 diabetes mellitus-she is on Metformin.  History of hyperlipidemia-she was on Lipitor in the past but she is not taking it now.  Other insomnia-she has history of chronic insomnia for many years.  Other fatigue-she gives history of increased fatigue since the COVID-19 infection.  GAD (generalized anxiety disorder)  History of depression  Vitamin D deficiency-she is on vitamin D supplement.  Chronic diarrhea-patient states she has had a history of chronic diarrhea since COVID-19 infection.  She had a colonoscopy and was told that she had inflammation but no treatment was required.  History of COVID-19 - 05/20202, Myocarditis and pulmonary scarring. Followed by cardiologist.  She was evaluated by pulmonologist for patient and was given inhaler to be used as needed.  Orders: Orders Placed This Encounter  Procedures  . XR Foot 2 Views Right  . XR Foot 2 Views Left  . XR Hand 2 View Right  . XR Hand 2 View Left   No orders of the defined types were placed in this encounter.     Follow-Up Instructions: Return for Pain in both hands.   Bo Merino, MD  Note - This record has been created using Editor, commissioning.  Chart creation errors have been sought, but may not always  have been located. Such creation errors do not reflect on  the standard of medical care.

## 2020-10-01 ENCOUNTER — Ambulatory Visit: Payer: Self-pay

## 2020-10-01 ENCOUNTER — Encounter: Payer: Self-pay | Admitting: Rheumatology

## 2020-10-01 ENCOUNTER — Other Ambulatory Visit: Payer: Self-pay

## 2020-10-01 ENCOUNTER — Ambulatory Visit (INDEPENDENT_AMBULATORY_CARE_PROVIDER_SITE_OTHER): Payer: 59 | Admitting: Rheumatology

## 2020-10-01 VITALS — BP 125/84 | HR 83 | Resp 17 | Ht 65.0 in | Wt 231.0 lb

## 2020-10-01 DIAGNOSIS — M79642 Pain in left hand: Secondary | ICD-10-CM | POA: Diagnosis not present

## 2020-10-01 DIAGNOSIS — G629 Polyneuropathy, unspecified: Secondary | ICD-10-CM | POA: Diagnosis not present

## 2020-10-01 DIAGNOSIS — E559 Vitamin D deficiency, unspecified: Secondary | ICD-10-CM

## 2020-10-01 DIAGNOSIS — Z8616 Personal history of COVID-19: Secondary | ICD-10-CM

## 2020-10-01 DIAGNOSIS — F411 Generalized anxiety disorder: Secondary | ICD-10-CM

## 2020-10-01 DIAGNOSIS — Z8659 Personal history of other mental and behavioral disorders: Secondary | ICD-10-CM

## 2020-10-01 DIAGNOSIS — R413 Other amnesia: Secondary | ICD-10-CM

## 2020-10-01 DIAGNOSIS — M79672 Pain in left foot: Secondary | ICD-10-CM

## 2020-10-01 DIAGNOSIS — M79671 Pain in right foot: Secondary | ICD-10-CM

## 2020-10-01 DIAGNOSIS — R5383 Other fatigue: Secondary | ICD-10-CM

## 2020-10-01 DIAGNOSIS — K529 Noninfective gastroenteritis and colitis, unspecified: Secondary | ICD-10-CM

## 2020-10-01 DIAGNOSIS — D696 Thrombocytopenia, unspecified: Secondary | ICD-10-CM

## 2020-10-01 DIAGNOSIS — M79641 Pain in right hand: Secondary | ICD-10-CM | POA: Diagnosis not present

## 2020-10-01 DIAGNOSIS — Z8639 Personal history of other endocrine, nutritional and metabolic disease: Secondary | ICD-10-CM

## 2020-10-01 DIAGNOSIS — G4709 Other insomnia: Secondary | ICD-10-CM

## 2020-10-01 HISTORY — DX: Personal history of other endocrine, nutritional and metabolic disease: Z86.39

## 2020-10-01 HISTORY — DX: Personal history of COVID-19: Z86.16

## 2020-10-01 HISTORY — DX: Generalized anxiety disorder: F41.1

## 2020-10-01 HISTORY — DX: Vitamin D deficiency, unspecified: E55.9

## 2020-10-01 HISTORY — DX: Personal history of other mental and behavioral disorders: Z86.59

## 2020-10-01 LAB — CBC WITH DIFFERENTIAL/PLATELET
Basophils Relative: 1.7 %
MPV: 13.2 fL — ABNORMAL HIGH (ref 7.5–12.5)
Neutro Abs: 3960 cells/uL (ref 1500–7800)

## 2020-10-01 NOTE — Patient Instructions (Signed)
Hand Exercises Hand exercises can be helpful for almost anyone. These exercises can strengthen the hands, improve flexibility and movement, and increase blood flow to the hands. These results can make work and daily tasks easier. Hand exercises can be especially helpful for people who have joint pain from arthritis or have nerve damage from overuse (carpal tunnel syndrome). These exercises can also help people who have injured a hand. Exercises Most of these hand exercises are gentle stretching and motion exercises. It is usually safe to do them often throughout the day. Warming up your hands before exercise may help to reduce stiffness. You can do this with gentle massage or by placing your hands in warm water for 10-15 minutes. It is normal to feel some stretching, pulling, tightness, or mild discomfort as you begin new exercises. This will gradually improve. Stop an exercise right away if you feel sudden, severe pain or your pain gets worse. Ask your health care provider which exercises are best for you. Knuckle bend or "claw" fist 1. Stand or sit with your arm, hand, and all five fingers pointed straight up. Make sure to keep your wrist straight during the exercise. 2. Gently bend your fingers down toward your palm until the tips of your fingers are touching the top of your palm. Keep your big knuckle straight and just bend the small knuckles in your fingers. 3. Hold this position for __________ seconds. 4. Straighten (extend) your fingers back to the starting position. Repeat this exercise 5-10 times with each hand. Full finger fist 1. Stand or sit with your arm, hand, and all five fingers pointed straight up. Make sure to keep your wrist straight during the exercise. 2. Gently bend your fingers into your palm until the tips of your fingers are touching the middle of your palm. 3. Hold this position for __________ seconds. 4. Extend your fingers back to the starting position, stretching every  joint fully. Repeat this exercise 5-10 times with each hand. Straight fist 1. Stand or sit with your arm, hand, and all five fingers pointed straight up. Make sure to keep your wrist straight during the exercise. 2. Gently bend your fingers at the big knuckle, where your fingers meet your hand, and the middle knuckle. Keep the knuckle at the tips of your fingers straight and try to touch the bottom of your palm. 3. Hold this position for __________ seconds. 4. Extend your fingers back to the starting position, stretching every joint fully. Repeat this exercise 5-10 times with each hand. Tabletop 1. Stand or sit with your arm, hand, and all five fingers pointed straight up. Make sure to keep your wrist straight during the exercise. 2. Gently bend your fingers at the big knuckle, where your fingers meet your hand, as far down as you can while keeping the small knuckles in your fingers straight. Think of forming a tabletop with your fingers. 3. Hold this position for __________ seconds. 4. Extend your fingers back to the starting position, stretching every joint fully. Repeat this exercise 5-10 times with each hand. Finger spread 1. Place your hand flat on a table with your palm facing down. Make sure your wrist stays straight as you do this exercise. 2. Spread your fingers and thumb apart from each other as far as you can until you feel a gentle stretch. Hold this position for __________ seconds. 3. Bring your fingers and thumb tight together again. Hold this position for __________ seconds. Repeat this exercise 5-10 times with each hand.   Making circles 1. Stand or sit with your arm, hand, and all five fingers pointed straight up. Make sure to keep your wrist straight during the exercise. 2. Make a circle by touching the tip of your thumb to the tip of your index finger. 3. Hold for __________ seconds. Then open your hand wide. 4. Repeat this motion with your thumb and each finger on your  hand. Repeat this exercise 5-10 times with each hand. Thumb motion 1. Sit with your forearm resting on a table and your wrist straight. Your thumb should be facing up toward the ceiling. Keep your fingers relaxed as you move your thumb. 2. Lift your thumb up as high as you can toward the ceiling. Hold for __________ seconds. 3. Bend your thumb across your palm as far as you can, reaching the tip of your thumb for the small finger (pinkie) side of your palm. Hold for __________ seconds. Repeat this exercise 5-10 times with each hand. Grip strengthening 1. Hold a stress ball or other soft ball in the middle of your hand. 2. Slowly increase the pressure, squeezing the ball as much as you can without causing pain. Think of bringing the tips of your fingers into the middle of your palm. All of your finger joints should bend when doing this exercise. 3. Hold your squeeze for __________ seconds, then relax. Repeat this exercise 5-10 times with each hand.   Contact a health care provider if:  Your hand pain or discomfort gets much worse when you do an exercise.  Your hand pain or discomfort does not improve within 2 hours after you exercise. If you have any of these problems, stop doing these exercises right away. Do not do them again unless your health care provider says that you can. Get help right away if:  You develop sudden, severe hand pain or swelling. If this happens, stop doing these exercises right away. Do not do them again unless your health care provider says that you can. This information is not intended to replace advice given to you by your health care provider. Make sure you discuss any questions you have with your health care provider. Document Revised: 12/05/2018 Document Reviewed: 08/15/2018 Elsevier Patient Education  2021 [employer]  

## 2020-10-02 LAB — COMPLETE METABOLIC PANEL WITH GFR
AG Ratio: 1.3 (calc) (ref 1.0–2.5)
AST: 15 U/L (ref 10–35)
BUN: 17 mg/dL (ref 7–25)
Sodium: 141 mmol/L (ref 135–146)
Total Protein: 7 g/dL (ref 6.1–8.1)

## 2020-10-02 LAB — CBC WITH DIFFERENTIAL/PLATELET: Hemoglobin: 12.4 g/dL (ref 11.7–15.5)

## 2020-10-04 ENCOUNTER — Telehealth: Payer: Self-pay | Admitting: *Deleted

## 2020-10-04 LAB — RHEUMATOID FACTOR: Rhuematoid fact SerPl-aCnc: 18 IU/mL — ABNORMAL HIGH (ref <14)

## 2020-10-04 LAB — CBC WITH DIFFERENTIAL/PLATELET
Absolute Monocytes: 587 cells/uL (ref 200–950)
Basophils Absolute: 112 cells/uL (ref 0–200)
Eosinophils Absolute: 251 cells/uL (ref 15–500)
Eosinophils Relative: 3.8 %
HCT: 38 % (ref 35.0–45.0)
Lymphs Abs: 1690 cells/uL (ref 850–3900)
MCH: 29.7 pg (ref 27.0–33.0)
MCHC: 32.6 g/dL (ref 32.0–36.0)
MCV: 91.1 fL (ref 80.0–100.0)
Monocytes Relative: 8.9 %
Neutrophils Relative %: 60 %
Platelets: 308 10*3/uL (ref 140–400)
RBC: 4.17 10*6/uL (ref 3.80–5.10)
RDW: 12.3 % (ref 11.0–15.0)
Total Lymphocyte: 25.6 %
WBC: 6.6 10*3/uL (ref 3.8–10.8)

## 2020-10-04 LAB — COMPLETE METABOLIC PANEL WITH GFR
ALT: 19 U/L (ref 6–29)
Albumin: 4 g/dL (ref 3.6–5.1)
Alkaline phosphatase (APISO): 83 U/L (ref 37–153)
CO2: 27 mmol/L (ref 20–32)
Calcium: 9.1 mg/dL (ref 8.6–10.4)
Chloride: 104 mmol/L (ref 98–110)
Creat: 0.98 mg/dL (ref 0.50–1.05)
GFR, Est African American: 75 mL/min/{1.73_m2} (ref >=60)
GFR, Est Non African American: 65 mL/min/{1.73_m2} (ref >=60)
Globulin: 3 g/dL (calc) (ref 1.9–3.7)
Glucose, Bld: 272 mg/dL — ABNORMAL HIGH (ref 65–99)
Potassium: 4.8 mmol/L (ref 3.5–5.3)
Total Bilirubin: 0.3 mg/dL (ref 0.2–1.2)

## 2020-10-04 LAB — URIC ACID: Uric Acid, Serum: 5.7 mg/dL (ref 2.5–7.0)

## 2020-10-04 LAB — SEDIMENTATION RATE: Sed Rate: 79 mm/h — ABNORMAL HIGH (ref 0–30)

## 2020-10-04 LAB — CYCLIC CITRUL PEPTIDE ANTIBODY, IGG: Cyclic Citrullin Peptide Ab: <16 UNITS

## 2020-10-04 NOTE — Telephone Encounter (Signed)
Patient was in office on 10/01/2020 for her new patient visit. Patient was inquiring about an ultrasound. Please advise.

## 2020-10-04 NOTE — Progress Notes (Signed)
I will discuss results at the follow-up visit.  Please notify patient that glucose is elevated.  Please forward results to her PCP.

## 2020-10-04 NOTE — Telephone Encounter (Signed)
Please schedule ultrasound       Thank you

## 2020-10-04 NOTE — Telephone Encounter (Signed)
Okay to schedule ultrasound of bilateral hands to rule out synovitis.

## 2020-10-05 NOTE — Telephone Encounter (Signed)
Patient's ultrasound is scheduled for 12/29/20 at 2:30 pm

## 2020-10-24 NOTE — Progress Notes (Unsigned)
Office Visit Note  Patient: Alexandra Carr             Date of Birth: 05/29/1966           MRN: [id number]             PCP: Nicola Girt, DO Referring: Nicola Girt, DO Visit Date: 10/28/2020 Occupation: _0 @  Subjective:  Pain in multiple joints.   History of Present Illness: Alexandra Carr is a 55 y.o. female with history of osteoarthritis and positive rheumatoid factor.  She continues to have pain and discomfort in her bilateral hands, bilateral feet.  She states she also has discomfort in her right hip for a long time and lower back.  She denies any difficulty getting up from the chair raising her arms.  She has discomfort in her knees and her lower back when she gets up from the chair.  She denies any recent infection.  Activities of Daily Living:  Patient reports morning stiffness for several hours.   Patient Reports nocturnal pain.  Difficulty dressing/grooming: Reports Difficulty climbing stairs: Reports Difficulty getting out of chair: Reports Difficulty using hands for taps, buttons, cutlery, and/or writing: Reports  Review of Systems  Constitutional: Positive for fatigue. Negative for night sweats, weight gain and weight loss.  HENT: Positive for nose dryness. Negative for mouth sores, trouble swallowing, trouble swallowing and mouth dryness.   Eyes: Positive for visual disturbance. Negative for pain, redness, itching and dryness.  Respiratory: Negative for cough, hemoptysis, shortness of breath and difficulty breathing.   Cardiovascular: Negative for chest pain, palpitations, hypertension, irregular heartbeat and swelling in legs/feet.  Gastrointestinal: Positive for diarrhea. Negative for abdominal pain, blood in stool and constipation.  Endocrine: Negative for increased urination.  Genitourinary: Negative for painful urination and vaginal dryness.  Musculoskeletal: Positive for arthralgias, joint pain, myalgias, morning stiffness, muscle tenderness and  myalgias. Negative for joint swelling and muscle weakness.  Skin: Negative for color change, rash, hair loss, redness, skin tightness, ulcers and sensitivity to sunlight.  Allergic/Immunologic: Negative for susceptible to infections.  Neurological: Positive for numbness, memory loss and weakness. Negative for dizziness, headaches and night sweats.  Hematological: Negative for swollen glands.  Psychiatric/Behavioral: Negative for depressed mood, confusion and sleep disturbance. The patient is not nervous/anxious.     PMFS History:  Patient Active Problem List   Diagnosis Date Noted  . History of COVID-19 10/01/2020  . Vitamin D deficiency 10/01/2020  . History of depression 10/01/2020  . GAD (generalized anxiety disorder) 10/01/2020  . Other insomnia 10/01/2020  . History of hyperlipidemia 10/01/2020  . History of type 2 diabetes mellitus 10/01/2020    Past Medical History:  Diagnosis Date  . Anxiety   . Depressed   . Diabetes mellitus without complication ([place])   . Hyperlipemia     Family History  Problem Relation Age of Onset  . Dementia Mother   . Diabetes Mother   . Thyroid cancer Mother   . High Cholesterol Mother   . Heart failure Father   . Skin cancer Father   . Healthy Son   . Healthy Daughter    Past Surgical History:  Procedure Laterality Date  . APPENDECTOMY    . BREAST SURGERY    . TUBAL LIGATION     Social History   Social History Narrative  . Not on file    There is no immunization history on file for this patient.   Objective: Vital Signs: BP 134/73 (BP Location: Left Arm,  Patient Position: Sitting, Cuff Size: Normal)   Pulse 82   Ht _0  (1.676 m)   Wt 235 lb (106.6 kg)   BMI 37.93 kg/m    Physical Exam Vitals and nursing note reviewed.  Constitutional:      Appearance: She is well-developed and well-nourished.  HENT:     Head: Normocephalic and atraumatic.  Eyes:     Extraocular Movements: EOM normal.     Conjunctiva/sclera:  Conjunctivae normal.  Cardiovascular:     Rate and Rhythm: Normal rate and regular rhythm.     Pulses: Intact distal pulses.     Heart sounds: Normal heart sounds.  Pulmonary:     Effort: Pulmonary effort is normal.     Breath sounds: Normal breath sounds.  Abdominal:     General: Bowel sounds are normal.     Palpations: Abdomen is soft.  Musculoskeletal:     Cervical back: Normal range of motion.  Lymphadenopathy:     Cervical: No cervical adenopathy.  Skin:    General: Skin is warm and dry.     Capillary Refill: Capillary refill takes less than 2 seconds.  Neurological:     Mental Status: She is alert and oriented to person, place, and time.  Psychiatric:        Mood and Affect: Mood and affect normal.        Behavior: Behavior normal.      Musculoskeletal Exam: C-spine was in good range of motion.  She has some discomfort in the lower lumbar region but no point tenderness.  No SI joint tenderness was noted.  Shoulder joints with good range of motion.  She had no muscular weakness or tenderness.  Elbow joints and wrist joints with good range of motion with no synovitis.  She has tenderness of over bilateral CMC joints.  She also has PIP and DIP prominence.  Hip joints were in good range of motion with some discomfort with range of motion of her right hip joint.  She has some tenderness over right trochanteric bursa.  Knee joints with good range of motion without any warmth swelling or effusion.  She had no tenderness over ankles or MTPs.  CDAI Exam: CDAI Score: - Patient Global: -; Provider Global: - Swollen: -; Tender: - Joint Exam 10/28/2020   No joint exam has been documented for this visit   There is currently no information documented on the homunculus. Go to the Rheumatology activity and complete the homunculus joint exam.  Investigation: No additional findings.  Imaging: XR Foot 2 Views Left  Result Date: 10/01/2020 First MTP, PIP and DIP narrowing was noted.   Inferior and posterior calcaneal spurs were noted.  No intertarsal, tibiotalar or subtalar joint space narrowing was noted.  No erosive changes were noted. Impression: These findings are consistent with osteoarthritis of the foot.  XR Foot 2 Views Right  Result Date: 10/01/2020 First MTP, PIP and DIP narrowing was noted.  Inferior and posterior calcaneal spurs were noted.  No intertarsal, tibiotalar or subtalar joint space narrowing was noted.  No erosive changes were noted. Impression: These findings are consistent with osteoarthritis of the foot.  XR Hand 2 View Left  Result Date: 10/01/2020 CMC, PIP and DIP narrowing was noted.  No MCP, intercarpal or radiocarpal joint space narrowing was noted.  No erosive changes were noted. Impression: These findings are consistent with osteoarthritis of the hand.  XR Hand 2 View Right  Result Date: 10/01/2020 CMC, PIP and DIP narrowing was  noted.  No MCP, intercarpal or radiocarpal joint space narrowing was noted.  No erosive changes were noted. Impression: These findings are consistent with osteoarthritis of the hand.   Recent Labs: Lab Results  Component Value Date   WBC 6.6 10/01/2020   HGB 12.4 10/01/2020   PLT 308 10/01/2020   NA 141 10/01/2020   K 4.8 10/01/2020   CL 104 10/01/2020   CO2 27 10/01/2020   GLUCOSE 272 (H) 10/01/2020   BUN 17 10/01/2020   CREATININE 0.98 10/01/2020   BILITOT 0.3 10/01/2020   AST 15 10/01/2020   ALT 19 10/01/2020   PROT 7.0 10/01/2020   CALCIUM 9.1 10/01/2020   GFRAA 75 10/01/2020   October 01, 2020 ESR 79, RF 18, anti-CCP negative, uric acid 5.7  Speciality Comments: No specialty comments available.  Procedures:  No procedures performed Allergies: Gadobutrol   Assessment / Plan:     Visit Diagnoses: Primary osteoarthritis of both hands - Positive RF, elevated ESR.  No synovitis was noted.  X-rays were unremarkable.  Rheumatoid factor is low titer and not significant.  I will schedule ultrasound of  her bilateral hands to evaluate this further.  Plan ultrasound bilateral hands.  Rheumatoid factor positive - She has low titer positive rheumatoid factor.  Anti-CCP is negative.  No synovitis was noted.  Elevated sed rate -uncertain about the etiology of elevated sedimentation rate.  She had no evidence of inflammatory arthritis or polymyalgia rheumatica.  I will repeat the sed rate today.  I will also obtain SPEP.  I reviewed her records from [facility] which showed that SPEP was normal in April 2021.  She denies any infection.  Plan: Sedimentation rate, Serum protein electrophoresis with reflex  Pain in right hip-she has some discomfort range of motion of her right hip joint.  She declined x-ray.  She also had symptoms of right trochanteric bursitis.  Primary osteoarthritis of both feet - She complains of neuropathy.  According the patient extensive neurological work-up including EMG and NCV's were negative.  Gabapentin helps per patient.  Chronic midline low back pain without sciatica-she complains of chronic lower back pain.  She denies any radiculopathy.  Have given her a handout on back exercises.  Neuropathy  History of type 2 diabetes mellitus - Treated with Metformin.  Other fatigue  Other insomnia  Memory loss - Since she had COVID-19 infection.  Anxiety and depression  Vitamin D deficiency-she gives history of fatigue and vitamin D deficiency for many years.  Orders: Orders Placed This Encounter  Procedures  . Sedimentation rate  . Serum protein electrophoresis with reflex   No orders of the defined types were placed in this encounter.    Follow-Up Instructions: Return in about 2 months (around 12/28/2020).   Bo Merino, MD  Note - This record has been created using Editor, commissioning.  Chart creation errors have been sought, but may not always  have been located. Such creation errors do not reflect on  the standard of medical care.

## 2020-10-28 ENCOUNTER — Encounter: Payer: Self-pay | Admitting: Rheumatology

## 2020-10-28 ENCOUNTER — Other Ambulatory Visit: Payer: Self-pay

## 2020-10-28 ENCOUNTER — Ambulatory Visit (INDEPENDENT_AMBULATORY_CARE_PROVIDER_SITE_OTHER): Payer: 59 | Admitting: Rheumatology

## 2020-10-28 VITALS — BP 134/73 | HR 82 | Ht 66.0 in | Wt 235.0 lb

## 2020-10-28 DIAGNOSIS — M19071 Primary osteoarthritis, right ankle and foot: Secondary | ICD-10-CM

## 2020-10-28 DIAGNOSIS — R768 Other specified abnormal immunological findings in serum: Secondary | ICD-10-CM

## 2020-10-28 DIAGNOSIS — M19041 Primary osteoarthritis, right hand: Secondary | ICD-10-CM

## 2020-10-28 DIAGNOSIS — F32A Depression, unspecified: Secondary | ICD-10-CM

## 2020-10-28 DIAGNOSIS — R700 Elevated erythrocyte sedimentation rate: Secondary | ICD-10-CM | POA: Diagnosis not present

## 2020-10-28 DIAGNOSIS — R413 Other amnesia: Secondary | ICD-10-CM

## 2020-10-28 DIAGNOSIS — R5383 Other fatigue: Secondary | ICD-10-CM

## 2020-10-28 DIAGNOSIS — F419 Anxiety disorder, unspecified: Secondary | ICD-10-CM

## 2020-10-28 DIAGNOSIS — M25551 Pain in right hip: Secondary | ICD-10-CM

## 2020-10-28 DIAGNOSIS — M19072 Primary osteoarthritis, left ankle and foot: Secondary | ICD-10-CM

## 2020-10-28 DIAGNOSIS — M545 Low back pain, unspecified: Secondary | ICD-10-CM

## 2020-10-28 DIAGNOSIS — Z8639 Personal history of other endocrine, nutritional and metabolic disease: Secondary | ICD-10-CM

## 2020-10-28 DIAGNOSIS — G629 Polyneuropathy, unspecified: Secondary | ICD-10-CM

## 2020-10-28 DIAGNOSIS — G4709 Other insomnia: Secondary | ICD-10-CM

## 2020-10-28 DIAGNOSIS — G8929 Other chronic pain: Secondary | ICD-10-CM

## 2020-10-28 DIAGNOSIS — M19042 Primary osteoarthritis, left hand: Secondary | ICD-10-CM

## 2020-10-28 DIAGNOSIS — E559 Vitamin D deficiency, unspecified: Secondary | ICD-10-CM

## 2020-10-28 NOTE — Patient Instructions (Signed)

## 2020-11-01 LAB — SEDIMENTATION RATE: Sed Rate: 33 mm/h — ABNORMAL HIGH (ref 0–30)

## 2020-11-01 LAB — PROTEIN ELECTROPHORESIS, SERUM, WITH REFLEX
Albumin ELP: 3.7 g/dL — ABNORMAL LOW (ref 3.8–4.8)
Alpha 1: 0.3 g/dL (ref 0.2–0.3)
Alpha 2: 0.8 g/dL (ref 0.5–0.9)
Beta 2: 0.4 g/dL (ref 0.2–0.5)
Beta Globulin: 0.4 g/dL (ref 0.4–0.6)
Gamma Globulin: 1 g/dL (ref 0.8–1.7)
Total Protein: 6.5 g/dL (ref 6.1–8.1)

## 2020-11-01 NOTE — Progress Notes (Signed)
Sed rate improved, almost close to normal.  SPEP was normal.

## 2020-11-05 ENCOUNTER — Telehealth: Payer: Self-pay | Admitting: Rheumatology

## 2020-11-05 NOTE — Telephone Encounter (Signed)
I called [employer], no records were faxed for disability, advised [employer] to fax documents to office for review.

## 2020-11-05 NOTE — Telephone Encounter (Signed)
Second page, back page with doctors signature did not get faxed. Please refax to [phone].

## 2020-12-29 ENCOUNTER — Ambulatory Visit (INDEPENDENT_AMBULATORY_CARE_PROVIDER_SITE_OTHER): Payer: 59 | Admitting: Rheumatology

## 2020-12-29 ENCOUNTER — Other Ambulatory Visit: Payer: Self-pay

## 2020-12-29 ENCOUNTER — Ambulatory Visit: Payer: Self-pay

## 2020-12-29 DIAGNOSIS — M19042 Primary osteoarthritis, left hand: Secondary | ICD-10-CM

## 2020-12-29 DIAGNOSIS — M19041 Primary osteoarthritis, right hand: Secondary | ICD-10-CM | POA: Diagnosis not present

## 2021-05-24 ENCOUNTER — Other Ambulatory Visit: Payer: Self-pay

## 2021-05-24 DIAGNOSIS — F419 Anxiety disorder, unspecified: Secondary | ICD-10-CM | POA: Insufficient documentation

## 2021-05-24 DIAGNOSIS — E119 Type 2 diabetes mellitus without complications: Secondary | ICD-10-CM | POA: Insufficient documentation

## 2021-05-24 DIAGNOSIS — I472 Ventricular tachycardia, unspecified: Secondary | ICD-10-CM | POA: Insufficient documentation

## 2021-05-24 DIAGNOSIS — R0602 Shortness of breath: Secondary | ICD-10-CM | POA: Insufficient documentation

## 2021-05-24 DIAGNOSIS — E785 Hyperlipidemia, unspecified: Secondary | ICD-10-CM | POA: Insufficient documentation

## 2021-05-24 DIAGNOSIS — I514 Myocarditis, unspecified: Secondary | ICD-10-CM | POA: Insufficient documentation

## 2021-05-24 DIAGNOSIS — R072 Precordial pain: Secondary | ICD-10-CM | POA: Insufficient documentation

## 2021-06-07 DIAGNOSIS — U099 Post covid-19 condition, unspecified: Secondary | ICD-10-CM | POA: Insufficient documentation

## 2021-06-07 HISTORY — DX: Post covid-19 condition, unspecified: U09.9

## 2021-06-10 ENCOUNTER — Encounter: Payer: Self-pay | Admitting: Cardiology

## 2021-06-10 ENCOUNTER — Other Ambulatory Visit: Payer: Self-pay

## 2021-06-10 ENCOUNTER — Ambulatory Visit (INDEPENDENT_AMBULATORY_CARE_PROVIDER_SITE_OTHER): Payer: 59 | Admitting: Cardiology

## 2021-06-10 VITALS — BP 118/74 | HR 59 | Ht 66.0 in | Wt 227.2 lb

## 2021-06-10 DIAGNOSIS — R011 Cardiac murmur, unspecified: Secondary | ICD-10-CM

## 2021-06-10 DIAGNOSIS — E782 Mixed hyperlipidemia: Secondary | ICD-10-CM

## 2021-06-10 DIAGNOSIS — I472 Ventricular tachycardia, unspecified: Secondary | ICD-10-CM

## 2021-06-10 DIAGNOSIS — E669 Obesity, unspecified: Secondary | ICD-10-CM | POA: Insufficient documentation

## 2021-06-10 DIAGNOSIS — E119 Type 2 diabetes mellitus without complications: Secondary | ICD-10-CM | POA: Diagnosis not present

## 2021-06-10 DIAGNOSIS — Z9884 Bariatric surgery status: Secondary | ICD-10-CM

## 2021-06-10 DIAGNOSIS — Z8616 Personal history of COVID-19: Secondary | ICD-10-CM

## 2021-06-10 HISTORY — DX: Cardiac murmur, unspecified: R01.1

## 2021-06-10 HISTORY — DX: Obesity, unspecified: E66.9

## 2021-06-10 NOTE — Patient Instructions (Signed)
Medication Instructions:  No medication changes. *If you need a refill on your cardiac medications before your next appointment, please call your pharmacy*   Lab Work: None ordered If you have labs (blood work) drawn today and your tests are completely normal, you will receive your results only by: MyChart Message (if you have MyChart) OR A paper copy in the mail If you have any lab test that is abnormal or we need to change your treatment, we will call you to review the results.   Testing/Procedures: Your physician has requested that you have an echocardiogram. Echocardiography is a painless test that uses sound waves to create images of your heart. It provides your doctor with information about the size and shape of your heart and how well your heart's chambers and valves are working. This procedure takes approximately one hour. There are no restrictions for this procedure.    Follow-Up: At [facility], you and your health needs are our priority.  As part of our continuing mission to provide you with exceptional heart care, we have created designated Provider Care Teams.  These Care Teams include your primary Cardiologist (physician) and Advanced Practice Providers (APPs -  Physician Assistants and Nurse Practitioners) who all work together to provide you with the care you need, when you need it.  We recommend signing up for the patient portal called "MyChart".  Sign up information is provided on this After Visit Summary.  MyChart is used to connect with patients for Virtual Visits (Telemedicine).  Patients are able to view lab/test results, encounter notes, upcoming appointments, etc.  Non-urgent messages can be sent to your provider as well.   To learn more about what you can do with MyChart, go to [email or web address].    Your next appointment:   6 month(s)  The format for your next appointment:   In Person  Provider:   Rajan Revankar, MD   Other  Instructions Echocardiogram An echocardiogram is a test that uses sound waves (ultrasound) to produce images of the heart. Images from an echocardiogram can provide important information about: Heart size and shape. The size and thickness and movement of your heart's walls. Heart muscle function and strength. Heart valve function or if you have stenosis. Stenosis is when the heart valves are too narrow. If blood is flowing backward through the heart valves (regurgitation). A tumor or infectious growth around the heart valves. Areas of heart muscle that are not working well because of poor blood flow or injury from a heart attack. Aneurysm detection. An aneurysm is a weak or damaged part of an artery wall. The wall bulges out from the normal force of blood pumping through the body. Tell a health care provider about: Any allergies you have. All medicines you are taking, including vitamins, herbs, eye drops, creams, and over-the-counter medicines. Any blood disorders you have. Any surgeries you have had. Any medical conditions you have. Whether you are pregnant or may be pregnant. What are the risks? Generally, this is a safe test. However, problems may occur, including an allergic reaction to dye (contrast) that may be used during the test. What happens before the test? No specific preparation is needed. You may eat and drink normally. What happens during the test? You will take off your clothes from the waist up and put on a hospital gown. Electrodes or electrocardiogram (ECG)patches may be placed on your chest. The electrodes or patches are then connected to a device that monitors your heart rate and rhythm. You will   lie down on a table for an ultrasound exam. A gel will be applied to your chest to help sound waves pass through your skin. A handheld device, called a transducer, will be pressed against your chest and moved over your heart. The transducer produces sound waves that travel to  your heart and bounce back (or "echo" back) to the transducer. These sound waves will be captured in real-time and changed into images of your heart that can be viewed on a video monitor. The images will be recorded on a computer and reviewed by your health care provider. You may be asked to change positions or hold your breath for a short time. This makes it easier to get different views or better views of your heart. In some cases, you may receive contrast through an IV in one of your veins. This can improve the quality of the pictures from your heart. The procedure may vary among health care providers and hospitals.   What can I expect after the test? You may return to your normal, everyday life, including diet, activities, and medicines, unless your health care provider tells you not to do that. Follow these instructions at home: It is up to you to get the results of your test. Ask your health care provider, or the department that is doing the test, when your results will be ready. Keep all follow-up visits. This is important. Summary An echocardiogram is a test that uses sound waves (ultrasound) to produce images of the heart. Images from an echocardiogram can provide important information about the size and shape of your heart, heart muscle function, heart valve function, and other possible heart problems. You do not need to do anything to prepare before this test. You may eat and drink normally. After the echocardiogram is completed, you may return to your normal, everyday life, unless your health care provider tells you not to do that. This information is not intended to replace advice given to you by your health care provider. Make sure you discuss any questions you have with your health care provider. Document Revised: 04/06/2020 Document Reviewed: 04/06/2020 Elsevier Patient Education  2021 Elsevier Inc.   

## 2021-06-10 NOTE — Progress Notes (Signed)
Cardiology Office Note:    Date:  06/10/2021   ID:  Alexandra Carr, DOB 20-Jul-1966, MRN [id number]  PCP:  Marylen Ponto, MD  Cardiologist:  Garwin Brothers, MD   Referring MD: Marylen Ponto, MD    ASSESSMENT:    1. Mixed hyperlipidemia   2. Cardiac murmur   3. Ventricular tachycardia   4. Diabetes mellitus without complication (HCC)   5. History of COVID-19   6. S/P bariatric surgery   7. Obesity (BMI 35.0-39.9 without comorbidity)    PLAN:    In order of problems listed above:  Primary prevention stressed with the patient.  Importance of compliance with diet medication stressed and she vocalized understanding. Essential hypertension: Blood pressure stable and diet was emphasized.  Lifestyle modification urged. Mixed dyslipidemia: On statin therapy.  Diet emphasized.  Lipids followed by primary care. Diabetes mellitus: Hemoglobin A1c greater than 8 and I cautioned her against this.  Diet emphasized.  This is followed by primary care. History of ventricular tachycardia in the past.  No recurrences.  No symptoms.  Medical management at this time. , Cardiac murmur and history of myocarditis: Echocardiogram will be done to assess murmur on auscultation.  Patient was advised to walk at least half an hour a day 5 days a week and she promises to do so. Weight reduction stressed.  Risks of obesity explained and she plans to do better. Patient will be seen in follow-up appointment in 6 months or earlier if the patient has any concerns    Medication Adjustments/Labs and Tests Ordered: Current medicines are reviewed at length with the patient today.  Concerns regarding medicines are outlined above.  No orders of the defined types were placed in this encounter.  No orders of the defined types were placed in this encounter.    No chief complaint on file.    History of Present Illness:    Alexandra Carr is a 54 y.o. female.  Patient has past medical history of essential  hypertension, dyslipidemia, diabetes mellitus and history of COVID-19 pneumonitis.  She mentions to me that she leads a sedentary lifestyle.  She denies any problems at this time and takes care of activities of daily living.  No chest pain orthopnea or PND.  She had a stress test last year and it was unremarkable.  No change in symptoms.  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  At the time of my evaluation, the patient is alert awake oriented and in no distress.  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 Followed by cardiologist.   History of depression 10/01/2020   History of hyperlipidemia 10/01/2020   History of type 2 diabetes mellitus 10/01/2020   Hyperlipemia    Hyperlipidemia 08/08/2013   Insomnia 05/13/2013   Morbid obesity (HCC) 01/04/2012   Myocarditis (HCC)    Precordial chest pain    Protein-calorie malnutrition (HCC) 09/16/2013   Restrictive lung disease 01/22/2020   Formatting of this note might be different from the original.  FT 01/15/2020 ratio 7166% FVC 67% DLCO 69%   S/P bariatric surgery 09/16/2013   Shortness of breath    Thrombocytopenia (HCC) 01/23/2019   Ventricular tachycardia    Vitamin D deficiency 10/01/2020    Past Surgical History:  Procedure Laterality Date   APPENDECTOMY     BREAST SURGERY     TUBAL LIGATION      Current Medications: Current Meds  Medication Sig   albuterol (VENTOLIN HFA) 108 (90 Base) MCG/ACT inhaler Inhale 2  puffs into the lungs every 6 (six) hours as needed.   atorvastatin (LIPITOR) 10 MG tablet Take 10 mg by mouth at bedtime.   cyanocobalamin 1000 MCG tablet Take 1,000 mcg by mouth daily.   Eszopiclone 3 MG TABS Take 3 mg by mouth at bedtime.   gabapentin (NEURONTIN) 300 MG capsule Take 300 mg by mouth 3 (three) times daily.   glipiZIDE (GLUCOTROL XL) 10 MG 24 hr tablet Take 1 tablet by mouth daily.   JARDIANCE 10 MG TABS tablet Take 10 mg by mouth daily.   meloxicam (MOBIC) 7.5 MG tablet Take 7.5 mg by mouth daily.   metFORMIN (GLUMETZA) 1000 MG (MOD) 24 hr tablet Take 1,000 mg by mouth 2 (two) times daily with a meal.   metoprolol tartrate (LOPRESSOR) 25 MG tablet Take 25 mg by mouth 2 (two) times daily.   Vitamin D, Ergocalciferol, (DRISDOL) 1.25 MG (50000 UNIT) CAPS capsule Take 50,000 Units by mouth every 7 (seven) days.     Allergies:   Gadobutrol   Social History   Socioeconomic History   Marital status: Married    Spouse name: Not on file   Number of children: Not on file   Years of education: Not on file   Highest education level: Not on file  Occupational History   Not on file  Tobacco Use   Smoking status: Never   Smokeless tobacco: Never  Vaping Use   Vaping Use: Never used  Substance and Sexual Activity   Alcohol use: Yes    Comment: occ   Drug use: No   Sexual activity: Never    Birth control/protection: Surgical  Other Topics Concern   Not on file  Social History Narrative   Not on file   Social Determinants of Health   Financial Resource Strain: Not on file  Food Insecurity: Not on file  Transportation Needs: Not on file  Physical Activity: Not on file  Stress: Not on file  Social Connections: Not on file     Family History: The patient's family history includes Dementia in her mother; Diabetes in her mother; Healthy in her daughter and son; Heart failure in her father; High Cholesterol in her mother; Skin cancer in her father; Thyroid cancer in her  mother.  ROS:   Please see the history of present illness.    All other systems reviewed and are negative.  EKGs/Labs/Other Studies Reviewed:    The following studies were reviewed today: EKG reveals sinus rhythm and nonspecific ST-T changes   Recent Labs: 10/01/2020: ALT 19; BUN 17; Creat 0.98; Hemoglobin 12.4; Platelets 308; Potassium 4.8; Sodium 141  Recent Lipid Panel No results found for: CHOL, TRIG, HDL, CHOLHDL, VLDL, LDLCALC, LDLDIRECT  Physical Exam:    VS:  BP 118/74   Pulse (!) 59   Ht 5\' 6"  (1.676 m)   Wt 227 lb 3.2 oz (103.1 kg)   SpO2 96%   BMI 36.67 kg/m     Wt Readings from Last 3 Encounters:  06/10/21 227 lb 3.2 oz (103.1 kg)  10/28/20 235 lb (106.6 kg)  10/01/20 231 lb (104.8 kg)     GEN: Patient is in no acute distress HEENT: Normal NECK: No JVD; No carotid bruits LYMPHATICS: No lymphadenopathy CARDIAC: Hear sounds regular, 2/6 systolic murmur at the apex. RESPIRATORY:  Clear to auscultation without rales, wheezing or rhonchi  ABDOMEN: Soft, non-tender, non-distended MUSCULOSKELETAL:  No edema; No deformity  SKIN: Warm and dry NEUROLOGIC:  Alert and oriented x 3 PSYCHIATRIC:  Normal affect   Signed, Garwin Brothers, MD  06/10/2021 1:53 PM    [facility]

## 2021-06-27 ENCOUNTER — Other Ambulatory Visit: Payer: Self-pay

## 2021-06-27 ENCOUNTER — Ambulatory Visit (INDEPENDENT_AMBULATORY_CARE_PROVIDER_SITE_OTHER): Payer: 59

## 2021-06-27 DIAGNOSIS — R011 Cardiac murmur, unspecified: Secondary | ICD-10-CM | POA: Diagnosis not present

## 2021-06-27 LAB — ECHOCARDIOGRAM COMPLETE
Area-P 1/2: 4.57 cm2
S' Lateral: 3.3 cm

## 2022-02-07 ENCOUNTER — Other Ambulatory Visit: Payer: Self-pay

## 2022-02-08 ENCOUNTER — Encounter: Payer: Self-pay | Admitting: Cardiology

## 2022-02-08 ENCOUNTER — Ambulatory Visit: Payer: Commercial Managed Care - HMO | Admitting: Cardiology

## 2022-02-08 VITALS — BP 118/74 | HR 72 | Ht 66.0 in | Wt 217.2 lb

## 2022-02-08 DIAGNOSIS — E782 Mixed hyperlipidemia: Secondary | ICD-10-CM | POA: Diagnosis not present

## 2022-02-08 DIAGNOSIS — E669 Obesity, unspecified: Secondary | ICD-10-CM | POA: Diagnosis not present

## 2022-02-08 DIAGNOSIS — I472 Ventricular tachycardia, unspecified: Secondary | ICD-10-CM

## 2022-02-08 DIAGNOSIS — E119 Type 2 diabetes mellitus without complications: Secondary | ICD-10-CM

## 2022-02-08 MED ORDER — ASPIRIN 81 MG PO TBEC
81.0000 mg | DELAYED_RELEASE_TABLET | Freq: Every day | ORAL | 3 refills | Status: AC
Start: 2022-02-08 — End: ?

## 2022-02-08 NOTE — Patient Instructions (Signed)

## 2022-02-08 NOTE — Progress Notes (Signed)
Cardiology Office Note:    Date:  02/08/2022   ID:  Alexandra Carr, DOB Dec 09, 1965, MRN [id number]  PCP:  Ronita Hipps, MD  Cardiologist:  Jenean Lindau, MD   Referring MD: Ronita Hipps, MD    ASSESSMENT:    1. Mixed hyperlipidemia   2. Diabetes mellitus without complication ([place])   3. Ventricular tachycardia (HCC)   4. Obesity (BMI 35.0-39.9 without comorbidity)    PLAN:    In order of problems listed above:  Primary prevention stressed with the patient.  Importance of compliance with diet medication stressed and she vocalized understanding.  She is walking on a regular basis and trying to lose weight.  She has no symptoms on walking. Essential hypertension: Blood pressure stable and diet was emphasized.  Lifestyle modification urged. Mixed dyslipidemia: I discussed lab work obtained from primary care at length. Diabetes mellitus: Elevated hemoglobin A1c and I cautioned her about this obesity and issues discussed weight reduction stressed risks of obesity and she promises to do better. Patient will be seen in follow-up appointment in 12 months or earlier if the patient has any concerns    Medication Adjustments/Labs and Tests Ordered: Current medicines are reviewed at length with the patient today.  Concerns regarding medicines are outlined above.  No orders of the defined types were placed in this encounter.  No orders of the defined types were placed in this encounter.    No chief complaint on file.    History of Present Illness:    Alexandra Carr is a 56 y.o. female.  Patient has past medical history of mixed dyslipidemia diabetes mellitus and obesity.  She has had history of sustained ventricular tachycardia.  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  She denies any problems at this time and takes care of activities of daily living.  No chest pain orthopnea or PND.  At the time of my evaluation, the patient is alert  awake oriented and in no distress.  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 Followed by cardiologist.   History of depression 10/01/2020   History of hyperlipidemia 10/01/2020   History of type 2 diabetes mellitus 10/01/2020   Hyperlipemia    Hyperlipidemia 08/08/2013   Insomnia 05/13/2013   Morbid obesity ([place]) 01/04/2012   Myocarditis ([place])    Obesity (BMI 35.0-39.9 without comorbidity) 06/10/2021   Precordial chest pain    Protein-calorie malnutrition ([place]) 09/16/2013   Restrictive lung disease 01/22/2020   Formatting of this note might be different from the original. FT 01/15/2020 ratio 7166% FVC 67% DLCO 69%   S/P bariatric surgery 09/16/2013   Shortness of breath    Thrombocytopenia ([place]) 01/23/2019   Tinnitus 03/07/2019   Ventricular tachycardia ([place])    Vitamin D deficiency 10/01/2020    Past Surgical History:  Procedure Laterality Date   APPENDECTOMY     BREAST SURGERY  TUBAL LIGATION      Current Medications: Current Meds  Medication Sig   albuterol (VENTOLIN HFA) 108 (90 Base) MCG/ACT inhaler Inhale 2 puffs into the lungs every 6 (six) hours as needed for wheezing or shortness of breath.   atorvastatin (LIPITOR) 10 MG tablet Take 10 mg by mouth at bedtime.   Eszopiclone 3 MG TABS Take 3 mg by mouth at bedtime.   FARXIGA 10 MG TABS tablet Take 10 mg by mouth daily.   gabapentin (NEURONTIN) 300 MG capsule Take 300 mg  by mouth every morning. And takes 300 mg at lunch and takes 600 mg in the evening   glipiZIDE (GLUCOTROL XL) 10 MG 24 hr tablet Take 1 tablet by mouth daily.   meloxicam (MOBIC) 7.5 MG tablet Take 7.5 mg by mouth daily.   metFORMIN (GLUCOPHAGE-XR) 500 MG 24 hr tablet Take 500 mg by mouth every morning. And takes 1000 mg in the evening   metoprolol tartrate (LOPRESSOR) 25 MG tablet Take 25 mg by mouth 2 (two) times daily.   Vitamin D, Ergocalciferol, (DRISDOL) 1.25 MG (50000 UNIT) CAPS capsule Take 50,000 Units by mouth every 7 (seven) days.     Allergies:   Gadobutrol   Social History   Socioeconomic History   Marital status: Married    Spouse name: Not on file   Number of children: Not on file   Years of education: Not on file   Highest education level: Not on file  Occupational History   Not on file  Tobacco Use   Smoking status: Never   Smokeless tobacco: Never  Vaping Use   Vaping Use: Never used  Substance and Sexual Activity   Alcohol use: Yes    Comment: occ   Drug use: No   Sexual activity: Never    Birth control/protection: Surgical  Other Topics Concern   Not on file  Social History Narrative   Not on file   Social Determinants of Health   Financial Resource Strain: Not on file  Food Insecurity: Not on file  Transportation Needs: Not on file  Physical Activity: Not on file  Stress: Not on file  Social Connections: Not on file     Family History: The patient's family history includes Dementia in her mother; Diabetes in her mother; Healthy in her daughter and son; Heart failure in her father; High Cholesterol in her mother; Skin cancer in her father; Thyroid cancer in her mother.  ROS:   Please see the history of present illness.    All other systems reviewed and are negative.  EKGs/Labs/Other Studies Reviewed:    The following studies were reviewed today: I discussed my findings with the patient at length   Recent Labs: No results found for  requested labs within last 365 days.  Recent Lipid Panel No results found for: "CHOL", "TRIG", "HDL", "CHOLHDL", "VLDL", "LDLCALC", "LDLDIRECT"  Physical Exam:    VS:  BP 118/74   Pulse 72   Ht 5\' 6"  (1.676 m)   Wt 217 lb 3.2 oz (98.5 kg)   SpO2 97%   BMI 35.06 kg/m     Wt Readings from Last 3 Encounters:  02/08/22 217 lb 3.2 oz (98.5 kg)  06/10/21 227 lb 3.2 oz (103.1 kg)  10/28/20 235 lb (106.6 kg)     GEN: Patient is in no acute distress HEENT: Normal NECK: No JVD; No carotid bruits LYMPHATICS: No lymphadenopathy CARDIAC: Hear sounds regular, 2/6 systolic murmur at the apex. RESPIRATORY:  Clear to  auscultation without rales, wheezing or rhonchi  ABDOMEN: Soft, non-tender, non-distended MUSCULOSKELETAL:  No edema; No deformity  SKIN: Warm and dry NEUROLOGIC:  Alert and oriented x 3 PSYCHIATRIC:  Normal affect   Signed, Jenean Lindau, MD  02/08/2022 1:09 PM    [place]
